# Patient Record
Sex: Female | Born: 1975 | Race: White | Hispanic: No | Marital: Married | Smoking: Current some day smoker
Health system: Southern US, Community
[De-identification: ages and names within clinical notes are randomized; demographics above are authoritative.]

## PROBLEM LIST (undated history)

## (undated) DIAGNOSIS — D75839 Thrombocytosis, unspecified: Secondary | ICD-10-CM

## (undated) DIAGNOSIS — R064 Hyperventilation: Secondary | ICD-10-CM

## (undated) DIAGNOSIS — R002 Palpitations: Secondary | ICD-10-CM

## (undated) DIAGNOSIS — G43909 Migraine, unspecified, not intractable, without status migrainosus: Secondary | ICD-10-CM

## (undated) DIAGNOSIS — R110 Nausea: Secondary | ICD-10-CM

## (undated) DIAGNOSIS — R42 Dizziness and giddiness: Secondary | ICD-10-CM

## (undated) DIAGNOSIS — R200 Anesthesia of skin: Secondary | ICD-10-CM

## (undated) DIAGNOSIS — D72829 Elevated white blood cell count, unspecified: Secondary | ICD-10-CM

## (undated) DIAGNOSIS — D473 Essential (hemorrhagic) thrombocythemia: Secondary | ICD-10-CM

## (undated) DIAGNOSIS — R000 Tachycardia, unspecified: Secondary | ICD-10-CM

## (undated) HISTORY — DX: Anesthesia of skin: R20.0

## (undated) HISTORY — DX: Elevated white blood cell count, unspecified: D72.829

## (undated) HISTORY — DX: Tachycardia, unspecified: R00.0

## (undated) HISTORY — DX: Migraine, unspecified, not intractable, without status migrainosus: G43.909

## (undated) HISTORY — PX: TONSILLECTOMY AND ADENOIDECTOMY: SUR1326

## (undated) HISTORY — DX: Thrombocytosis, unspecified: D75.839

## (undated) HISTORY — DX: Dizziness and giddiness: R42

## (undated) HISTORY — DX: Palpitations: R00.2

## (undated) HISTORY — DX: Nausea: R11.0

## (undated) HISTORY — DX: Hyperventilation: R06.4

## (undated) HISTORY — PX: MYRINGOTOMY WITH TUBE PLACEMENT: SHX5663

## (undated) HISTORY — DX: Essential (hemorrhagic) thrombocythemia: D47.3

---

## 1997-12-06 ENCOUNTER — Other Ambulatory Visit: Admission: RE | Admit: 1997-12-06 | Discharge: 1997-12-06 | Payer: Self-pay | Admitting: Obstetrics and Gynecology

## 1999-01-06 ENCOUNTER — Other Ambulatory Visit: Admission: RE | Admit: 1999-01-06 | Discharge: 1999-01-06 | Payer: Self-pay | Admitting: Obstetrics and Gynecology

## 2000-01-08 ENCOUNTER — Other Ambulatory Visit: Admission: RE | Admit: 2000-01-08 | Discharge: 2000-01-08 | Payer: Self-pay | Admitting: Obstetrics and Gynecology

## 2000-11-21 ENCOUNTER — Inpatient Hospital Stay (HOSPITAL_COMMUNITY): Admission: AD | Admit: 2000-11-21 | Discharge: 2000-11-23 | Payer: Self-pay | Admitting: Obstetrics and Gynecology

## 2000-11-21 ENCOUNTER — Encounter (INDEPENDENT_AMBULATORY_CARE_PROVIDER_SITE_OTHER): Payer: Self-pay | Admitting: Specialist

## 2000-12-30 ENCOUNTER — Other Ambulatory Visit: Admission: RE | Admit: 2000-12-30 | Discharge: 2000-12-30 | Payer: Self-pay | Admitting: Obstetrics and Gynecology

## 2001-04-12 ENCOUNTER — Other Ambulatory Visit: Admission: RE | Admit: 2001-04-12 | Discharge: 2001-04-12 | Payer: Self-pay | Admitting: Obstetrics and Gynecology

## 2002-01-03 ENCOUNTER — Other Ambulatory Visit: Admission: RE | Admit: 2002-01-03 | Discharge: 2002-01-03 | Payer: Self-pay | Admitting: Obstetrics and Gynecology

## 2003-01-10 ENCOUNTER — Other Ambulatory Visit: Admission: RE | Admit: 2003-01-10 | Discharge: 2003-01-10 | Payer: Self-pay | Admitting: Obstetrics and Gynecology

## 2004-01-11 ENCOUNTER — Other Ambulatory Visit: Admission: RE | Admit: 2004-01-11 | Discharge: 2004-01-11 | Payer: Self-pay | Admitting: Obstetrics and Gynecology

## 2004-01-14 ENCOUNTER — Encounter: Admission: RE | Admit: 2004-01-14 | Discharge: 2004-01-14 | Payer: Self-pay | Admitting: Obstetrics and Gynecology

## 2005-01-22 ENCOUNTER — Other Ambulatory Visit: Admission: RE | Admit: 2005-01-22 | Discharge: 2005-01-22 | Payer: Self-pay | Admitting: Obstetrics and Gynecology

## 2006-02-02 ENCOUNTER — Other Ambulatory Visit: Admission: RE | Admit: 2006-02-02 | Discharge: 2006-02-02 | Payer: Self-pay | Admitting: Obstetrics and Gynecology

## 2007-02-07 ENCOUNTER — Other Ambulatory Visit: Admission: RE | Admit: 2007-02-07 | Discharge: 2007-02-07 | Payer: Self-pay | Admitting: Obstetrics and Gynecology

## 2010-04-22 ENCOUNTER — Other Ambulatory Visit: Payer: Self-pay | Admitting: Obstetrics and Gynecology

## 2010-04-22 DIAGNOSIS — N6310 Unspecified lump in the right breast, unspecified quadrant: Secondary | ICD-10-CM

## 2010-04-24 ENCOUNTER — Ambulatory Visit
Admission: RE | Admit: 2010-04-24 | Discharge: 2010-04-24 | Disposition: A | Discharge status: Home / Self Care | Payer: PRIVATE HEALTH INSURANCE | Source: Ambulatory Visit | Attending: Obstetrics and Gynecology | Admitting: Obstetrics and Gynecology

## 2010-04-24 DIAGNOSIS — N6310 Unspecified lump in the right breast, unspecified quadrant: Secondary | ICD-10-CM

## 2010-08-01 NOTE — Discharge Summary (Signed)
[facility]  Patient:    Janice Silva, Janice Silva Visit Number: [id number] MRN: [id number]          Service Type: OBS Location: 910A [id number] Attending Physician:  Wandalee Ferdinand Dictated by:   Rudy Jew Ashley Royalty, M.D. Admit Date:  11/21/2000 Discharge Date: 11/23/2000                             Discharge Summary  DISCHARGE DIAGNOSES:          1. Intrauterine pregnancy at term, delivered.                               2. Meconium stained amniotic fluid.                               3. Anemia.                               4. Term birth living child, vertex.                               5. Postpartum hemorrhage due to uterine atony.  OPERATIONS AND SPECIAL PROCEDURES:                   OB delivery with episiotomy and episiorrhaphy.  CONSULTATIONS:                None.  DISCHARGE MEDICATIONS:        Chromagen.  HISTORY AND PHYSICAL:         This is a 35 year old, gravida 2, para 1, at 40 weeks 5 days gestation. Prenatal care was complicated by smoking which she discontinued Jul 15, 2000. Group B strep was negative. The patient presented complaining of labor with onset at approximately 11:30 p.m. on November 21, 2000. She denied rupture of membranes or bleeding.  HOSPITAL COURSE:              The patient was admitted to [facility] [place]. Initial cervical examination was 5 cm dilated, 90% effaced, -1 station, vertex presentation. Artificial rupture of membranes was accomplished which revealed light meconium. The patient went on to delivery at 2:45 p.m. on November 21, 2000. Infant was an 8-pound 1-ounce female, Apgars 9 at one minute and 9 at five minutes, and sent to newborn nursery. Delivery was accomplished per Dr. Lonell Face over second degree midline episiotomy which extended to a partial third degree. It was repaired without difficulty. Later that day, I was called regarding patients tachycardia and postural symptoms with  transient hypotension. Additional clots were expressed after delivery and bleeding subsequently abated. The patient responded favorably to a fluid bolus. Impression at that time was postpartum hemorrhage due to uterine atony with secondary hypovolemia. The patient was given increased IV fluids and vital signs watched closely. She responded nicely. She was noted to have a significant anemia with a hemoglobin of 5.5 subsequently and a hematocrit of 16.4. Coagulation studies were performed and except for a modest elevation of the PT (15.1), they were normal. On November 23, 2000 she denied any postural symptoms. Hemoglobin was 5.2. Decision was made to obtain another CBC later in the day to see if it was rising. At  12:40 p.m. the hemoglobin was noted to be 5.8 and the patient was felt to be a candidate for discharge. She was hence discharged home afebrile is satisfactory condition.  DISCHARGE INSTRUCTIONS:       Careful discharge instructions were provided.  DISCHARGE FOLLOWUP:           She is to return to [facility] in four to six weeks for postpartum evaluation and sooner, if needed. Dictated by:   Rudy Jew Ashley Royalty, M.D. Attending Physician:  Wandalee Ferdinand DD:  01/08/01 TD:  01/10/01 Job: [id number]

## 2012-12-31 ENCOUNTER — Ambulatory Visit (INDEPENDENT_AMBULATORY_CARE_PROVIDER_SITE_OTHER): Payer: 59 | Admitting: Physician Assistant

## 2012-12-31 VITALS — BP 118/72 | HR 89 | Temp 98.0°F | Resp 16 | Ht 66.5 in | Wt 141.0 lb

## 2012-12-31 DIAGNOSIS — H6120 Impacted cerumen, unspecified ear: Secondary | ICD-10-CM

## 2012-12-31 DIAGNOSIS — H6123 Impacted cerumen, bilateral: Secondary | ICD-10-CM

## 2012-12-31 DIAGNOSIS — H902 Conductive hearing loss, unspecified: Secondary | ICD-10-CM

## 2012-12-31 NOTE — Progress Notes (Signed)
  Subjective:    Patient ID: Janice Silva, female    DOB: Mar 08, 1976, 37 y.o.   MRN: [id number]  HPI 37 year old female presents for evaluation of bilateral ear fullness, decreased hearing, and irritation x 2 weeks.  Left ear worse than right - seem to be constant full feeling with significantly decreased hearing.  Right ear intermittently bothersome.  Denies otalgia, nasal congestion, PND, sore throat, headache, dizziness, or tinnitus.  No hx of cerumen impaction. She has been taking Sudafed which has not helped. Has also tried hydrogen peroxide which only made her canals itchy.   Patient is otherwise doing well with no other concerns today.     Review of Systems  HENT: Negative for congestion, ear discharge, ear pain, postnasal drip and sore throat.   Gastrointestinal: Negative for nausea and vomiting.  Neurological: Negative for dizziness and headaches.       Objective:   Physical Exam  Constitutional: She is oriented to person, place, and time. She appears well-developed and well-nourished.  HENT:  Head: Normocephalic and atraumatic.  Right Ear: Hearing, tympanic membrane, external ear and ear canal normal.  Left Ear: Hearing, tympanic membrane, external ear and ear canal normal.  Bilateral cerumen impaction. Normal TM visualized s/p irrigation  Eyes: Conjunctivae are normal.  Neck: Normal range of motion.  Cardiovascular: Normal rate.   Pulmonary/Chest: Effort normal.  Neurological: She is alert and oriented to person, place, and time.  Psychiatric: She has a normal mood and affect. Her behavior is normal. Judgment and thought content normal.          Assessment & Plan:   Cerumen impaction, bilateral - Plan: Ear wax removal, Ear wax removal  Conductive hearing loss  Ears irrigated today. Patient reports 100% resolution of symptoms s/p irrigation.  Ear care instructions provided Follow up as needed.

## 2013-09-27 ENCOUNTER — Ambulatory Visit (INDEPENDENT_AMBULATORY_CARE_PROVIDER_SITE_OTHER): Payer: 59 | Admitting: Family Medicine

## 2013-09-27 ENCOUNTER — Telehealth: Payer: Self-pay

## 2013-09-27 VITALS — BP 118/82 | HR 100 | Temp 97.7°F | Resp 18 | Ht 65.5 in | Wt 143.0 lb

## 2013-09-27 DIAGNOSIS — J029 Acute pharyngitis, unspecified: Secondary | ICD-10-CM

## 2013-09-27 LAB — POCT RAPID STREP A (OFFICE): Rapid Strep A Screen: NEGATIVE

## 2013-09-27 MED ORDER — AMOXICILLIN 875 MG PO TABS: 875.0000 mg | ORAL_TABLET | Freq: Two times a day (BID) | ORAL | Status: DC

## 2013-09-27 MED ORDER — IPRATROPIUM BROMIDE 0.06 % NA SOLN: 2.0000 | Freq: Four times a day (QID) | NASAL | Status: DC

## 2013-09-27 MED ORDER — IPRATROPIUM BROMIDE 0.03 % NA SOLN: 2.0000 | Freq: Two times a day (BID) | NASAL | Status: DC

## 2013-09-27 NOTE — Progress Notes (Signed)
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 Has 2 children, nobody has been sick. 2 days ago she started with severe head congestion when she got up. She has persisted with a very tight knows that she cannot breathe through. She is blowing clear mucus. Her throat has become very sore. She had low-grade fever yesterday for a while. No cough. Generally is a healthy person, only medication as her oral contraceptives.  Objective: TMs are normal. Nose congested and see she speaks nasally. Throat is very erythematous she has a thick white mucoid coating on the side of her throat behind the uvula. Strep screen and culture were taken. Neck was supple with major nodes. Chest is clear to auscultation. Heart regular without murmurs.  Assessment: Pharyngitis Rhinitis  Plan: Strep screen and culture if needed  Results for orders placed in visit on 09/27/13  POCT RAPID STREP A (OFFICE)      Result Value Ref Range   Rapid Strep A Screen Negative  Negative   Will treat empirically until the culture comes back. Continue for a full 10 days if it comes back positive, but if negative she can discontinue when the report comes back.

## 2013-09-27 NOTE — Telephone Encounter (Signed)
Colletta [place] the pharmacist from [place] called  [place] prescription for Ipratropium Bromide (Solution) ATROVENT 0.03 %  Has been discontinued and it only comes in .06% now. Please advise at [phone]

## 2013-09-27 NOTE — Telephone Encounter (Signed)
Meds ordered this encounter  Medications  . ipratropium (ATROVENT) 0.06 % nasal spray    Sig: Place 2 sprays into both nostrils 4 (four) times daily.    Dispense:  15 mL    Refill:  0    Order Specific Question:  Supervising Provider    Answer:  DOOLITTLE, ROBERT P [[id number]]

## 2013-09-27 NOTE — Patient Instructions (Signed)
Take amoxicillin 875 one twice daily. If the culture is negative you can discontinue this and destroyed the remaining pills  Use the Atrovent nasal spray 2 sprays each nostril 3 or 4 times daily as needed to open the nose  Return if worse  Pharyngitis Pharyngitis is redness, pain, and swelling (inflammation) of your pharynx.  CAUSES  Pharyngitis is usually caused by infection. Most of the time, these infections are from viruses (viral) and are part of a cold. However, sometimes pharyngitis is caused by bacteria (bacterial). Pharyngitis can also be caused by allergies. Viral pharyngitis may be spread from person to person by coughing, sneezing, and personal items or utensils (cups, forks, spoons, toothbrushes). Bacterial pharyngitis may be spread from person to person by more intimate contact, such as kissing.  SIGNS AND SYMPTOMS  Symptoms of pharyngitis include:   Sore throat.   Tiredness (fatigue).   Low-grade fever.   Headache.  Joint pain and muscle aches.  Skin rashes.  Swollen lymph nodes.  Plaque-like film on throat or tonsils (often seen with bacterial pharyngitis). DIAGNOSIS  Your health care provider will ask you questions about your illness and your symptoms. Your medical history, along with a physical exam, is often all that is needed to diagnose pharyngitis. Sometimes, a rapid strep test is done. Other lab tests may also be done, depending on the suspected cause.  TREATMENT  Viral pharyngitis will usually get better in 3-4 days without the use of medicine. Bacterial pharyngitis is treated with medicines that kill germs (antibiotics).  HOME CARE INSTRUCTIONS   Drink enough water and fluids to keep your urine clear or pale yellow.   Only take over-the-counter or prescription medicines as directed by your health care provider:   If you are prescribed antibiotics, make sure you finish them even if you start to feel better.   Do not take aspirin.   Get lots of  rest.   Gargle with 8 oz of salt water ( tsp of salt per 1 qt of water) as often as every 1-2 hours to soothe your throat.   Throat lozenges (if you are not at risk for choking) or sprays may be used to soothe your throat. SEEK MEDICAL CARE IF:   You have large, tender lumps in your neck.  You have a rash.  You cough up green, yellow-brown, or bloody spit. SEEK IMMEDIATE MEDICAL CARE IF:   Your neck becomes stiff.  You drool or are unable to swallow liquids.  You vomit or are unable to keep medicines or liquids down.  You have severe pain that does not go away with the use of recommended medicines.  You have trouble breathing (not caused by a stuffy nose). MAKE SURE YOU:   Understand these instructions.  Will watch your condition.  Will get help right away if you are not doing well or get worse. Document Released: 03/02/2005 Document Revised: 12/21/2012 Document Reviewed: 11/07/2012 [facility] Patient Information 2015 [place], [place]. This information is not intended to replace advice given to you by your health care provider. Make sure you discuss any questions you have with your health care provider.

## 2013-09-29 LAB — CULTURE, GROUP A STREP

## 2014-04-17 ENCOUNTER — Ambulatory Visit (INDEPENDENT_AMBULATORY_CARE_PROVIDER_SITE_OTHER): Payer: BLUE CROSS/BLUE SHIELD | Admitting: Physician Assistant

## 2014-04-17 VITALS — BP 118/82 | HR 106 | Temp 98.8°F | Resp 18 | Ht 66.25 in | Wt 143.0 lb

## 2014-04-17 DIAGNOSIS — H10022 Other mucopurulent conjunctivitis, left eye: Secondary | ICD-10-CM

## 2014-04-17 MED ORDER — POLYMYXIN B-TRIMETHOPRIM 10000-0.1 UNIT/ML-% OP SOLN: 1.0000 [drp] | Freq: Four times a day (QID) | OPHTHALMIC | Status: DC

## 2014-04-17 NOTE — Progress Notes (Signed)
04/17/2014 at 9:19 AM  Janice Silva / DOB: 1976-02-06 / MRN: [id number]  The patient  does not have a problem list on file.  SUBJECTIVE  Chief compalaint: Eye Problem  Eye Problem  The left eye is affected. This is a new problem. The current episode started yesterday. The problem has been waxing and waning. The pain is at a severity of 3/10. There is known exposure to pink eye. She wears contacts. Associated symptoms include an eye discharge (clear), eye redness and photophobia. Pertinent negatives include no foreign body sensation, itching, recent URI or vomiting. She has tried nothing for the symptoms.    She  has no past medical history on file.    She has a current medication list which includes the following prescription(s): ipratropium, norethindrone-ethinyl estradiol-iron, and trimethoprim-polymyxin b.  Janice Silva has No Known Allergies. She  reports that she has been smoking Cigarettes.  She does not have any smokeless tobacco history on file. She reports that she does not drink alcohol or use illicit drugs. She  reports that she currently engages in sexual activity.  The patient  has past surgical history that includes Tonsillectomy and adenoidectomy.  Her family history is not on file.  Review of Systems  Eyes: Positive for photophobia, discharge (clear) and redness.  Gastrointestinal: Negative for vomiting.  Skin: Negative for itching.    OBJECTIVE  her  height is 5' 6.25" (1.683 m) and weight is 143 lb (64.864 kg). Her oral temperature is 98.8 F (37.1 C). Her blood pressure is 118/82 and her pulse is 106. Her respiration is 18 and oxygen saturation is 99%.  The patient's body mass index is 22.9 kg/(m^2).  Physical Exam  Vitals reviewed. Constitutional: She is oriented to person, place, and time. She appears well-developed and well-nourished.  Eyes: Conjunctivae and EOM are normal. Pupils are equal, round, and reactive to light. Lids are everted and swept, no foreign  bodies found. Left eye exhibits discharge. Left eye exhibits no exudate and no hordeolum. No foreign body present in the left eye.  Slit lamp exam:      The left eye shows no corneal abrasion, no corneal ulcer, no foreign body, no hyphema, no hypopyon and no fluorescein uptake.  Cardiovascular: Normal rate.   Respiratory: Effort normal.  Neurological: She is alert and oriented to person, place, and time.  Skin: Skin is warm and dry.  Psychiatric: She has a normal mood and affect.     Visual Acuity Screening   Right eye Left eye Both eyes  Without correction:     With correction: 20/13 20/13 20/13   Comments: Pt. Can not see without glasses.   Procedure: Verbal consent obtained.  Two drops of proparacaine was placed in the eye and fluorescein strip applied directly to the eye.  The eye was viewed under UV light and no corneal abrasion or ulcer was identified.     No results found for this or any previous visit (from the past 24 hour(s)).  ASSESSMENT & PLAN  Janice Silva was seen today for eye problem.  Diagnoses and associated orders for this visit:  Pink eye disease of left eye: Symptoms and exam reassuring.  Vision equal and normal bilaterally. No abrasion or ulcer was identified on staining.  Will treat for viral etiology. Advised warm compress and OTC pain reliever of choice.  Should she begin to develop purulent discharge she is to fill the below antibiotic.  Advised that she call or RTC should she develop changes in  vision and/or nausea.   - trimethoprim-polymyxin b (POLYTRIM) ophthalmic solution; Place 1 drop into the left eye every 6 (six) hours.     The patient was instructed to to call or comeback to clinic as needed, or should symptoms warrant.  Philis Fendt, MHS, PA-C [facility] 04/17/2014 9:19 AM

## 2014-09-16 ENCOUNTER — Ambulatory Visit (INDEPENDENT_AMBULATORY_CARE_PROVIDER_SITE_OTHER): Payer: BLUE CROSS/BLUE SHIELD | Admitting: Urgent Care

## 2014-09-16 VITALS — BP 134/86 | HR 84 | Temp 98.1°F | Resp 18 | Ht 66.0 in | Wt 141.0 lb

## 2014-09-16 DIAGNOSIS — D473 Essential (hemorrhagic) thrombocythemia: Secondary | ICD-10-CM | POA: Diagnosis not present

## 2014-09-16 DIAGNOSIS — D72829 Elevated white blood cell count, unspecified: Secondary | ICD-10-CM

## 2014-09-16 DIAGNOSIS — R002 Palpitations: Secondary | ICD-10-CM

## 2014-09-16 DIAGNOSIS — R202 Paresthesia of skin: Secondary | ICD-10-CM | POA: Diagnosis not present

## 2014-09-16 DIAGNOSIS — R000 Tachycardia, unspecified: Secondary | ICD-10-CM

## 2014-09-16 DIAGNOSIS — R42 Dizziness and giddiness: Secondary | ICD-10-CM

## 2014-09-16 DIAGNOSIS — D75839 Thrombocytosis, unspecified: Secondary | ICD-10-CM

## 2014-09-16 DIAGNOSIS — R200 Anesthesia of skin: Secondary | ICD-10-CM

## 2014-09-16 LAB — POCT CBC
Granulocyte percent: 78.7 %G (ref 37–80)
HCT, POC: 42.8 % (ref 37.7–47.9)
Hemoglobin: 13.5 g/dL (ref 12.2–16.2)
Lymph, poc: 3.1 (ref 0.6–3.4)
MCH, POC: 26.9 pg — AB (ref 27–31.2)
MCHC: 31.5 g/dL — AB (ref 31.8–35.4)
MCV: 85.4 fL (ref 80–97)
MID (cbc): 0.6 (ref 0–0.9)
MPV: 6.6 fL (ref 0–99.8)
POC Granulocyte: 13.7 — AB (ref 2–6.9)
POC LYMPH PERCENT: 17.8 %L (ref 10–50)
POC MID %: 3.5 %M (ref 0–12)
Platelet Count, POC: 556 10*3/uL — AB (ref 142–424)
RBC: 5.02 M/uL (ref 4.04–5.48)
RDW, POC: 13.3 %
WBC: 17.4 10*3/uL — AB (ref 4.6–10.2)

## 2014-09-16 LAB — COMPREHENSIVE METABOLIC PANEL
ALT: 19 U/L (ref 0–35)
AST: 16 U/L (ref 0–37)
Albumin: 4 g/dL (ref 3.5–5.2)
Alkaline Phosphatase: 85 U/L (ref 39–117)
BUN: 6 mg/dL (ref 6–23)
CO2: 28 mEq/L (ref 19–32)
Calcium: 9.6 mg/dL (ref 8.4–10.5)
Chloride: 100 mEq/L (ref 96–112)
Creat: 0.79 mg/dL (ref 0.50–1.10)
Glucose, Bld: 84 mg/dL (ref 70–99)
Potassium: 5.1 mEq/L (ref 3.5–5.3)
Sodium: 138 mEq/L (ref 135–145)
Total Bilirubin: 0.2 mg/dL (ref 0.2–1.2)
Total Protein: 7.4 g/dL (ref 6.0–8.3)

## 2014-09-16 LAB — TSH: TSH: 0.735 u[IU]/mL (ref 0.350–4.500)

## 2014-09-16 LAB — HIV ANTIBODY (ROUTINE TESTING W REFLEX): HIV 1&2 Ab, 4th Generation: NONREACTIVE

## 2014-09-16 LAB — GLUCOSE, POCT (MANUAL RESULT ENTRY): POC Glucose: 82 mg/dl (ref 70–99)

## 2014-09-16 MED ORDER — PROPRANOLOL HCL 10 MG PO TABS: 10.0000 mg | ORAL_TABLET | Freq: Two times a day (BID) | ORAL | Status: DC

## 2014-09-16 NOTE — Progress Notes (Signed)
MRN: [id number] DOB: 12/10/1975  Subjective:   Janice Silva is a 39 y.o. female presenting for chief complaint of Palpitations; Dizziness; and Nausea  Reports 3 day history of 2 episodes of palpitations, heart racing, dizziness, hyperventilation, nausea, numbness and tingling of hands and feet, becomes very pale. Episodes have lasted <5 minutes without any known aggravating factors. Of note, patient was at work on Thursday when first episode occurred, was seen and stabilized by EMT without any significant findings. Today, patient had another episode shortly after arriving to church this morning, episode resolved on its own. Denies chest pain, diaphoresis, limb pain, neck pain, jaw pain, syncope. Denies history of heart disease, diabetes, abnormal heart rhythms. Drinks ~2 cups of coffee daily and hydrates well throughout the day thereafter. Smokes <1/2 ppd, drinks 1 alcohol drink per week. Denies any other aggravating or relieving factors, no other questions or concerns.  Janice Silva has a current medication list which includes the following prescription(s): norethindrone-ethinyl estradiol-iron. She has No Known Allergies.  Janice Silva  has no past medical history on file. Also  has past surgical history that includes Tonsillectomy and adenoidectomy.  ROS As in subjective.  Objective:   Vitals: BP 134/86 mmHg  Pulse 84  Temp(Src) 98.1 F (36.7 C)  Resp 18  Ht 5\' 6"  (1.676 m)  Wt 141 lb (63.957 kg)  BMI 22.77 kg/m2  SpO2 98%  LMP 09/16/2014  Physical Exam  Constitutional: She is oriented to person, place, and time. She appears well-developed and well-nourished.  HENT:  Mouth/Throat: Oropharynx is clear and moist.  Eyes: Conjunctivae are normal. Pupils are equal, round, and reactive to light. No scleral icterus.  Neck: Normal range of motion. Neck supple. No thyromegaly present.  Cardiovascular: Normal rate, regular rhythm and intact distal pulses.  Exam reveals no gallop and no friction  rub.   No murmur heard. Pulmonary/Chest: No stridor. No respiratory distress. She has no wheezes. She has no rales.  Abdominal: Soft. Bowel sounds are normal. She exhibits no distension and no mass. There is no tenderness.  Musculoskeletal: Normal range of motion. She exhibits no edema or tenderness.  Lymphadenopathy:    She has no cervical adenopathy.  Neurological: She is alert and oriented to person, place, and time.  Skin: Skin is warm and dry. No rash noted. No erythema. No pallor.  Psychiatric: She has a normal mood and affect.   Results for orders placed or performed in visit on 09/16/14 (from the past 24 hour(s))  POCT glucose (manual entry)     Status: None   Collection Time: 09/16/14  1:33 PM  Result Value Ref Range   POC Glucose 82 70 - 99 mg/dl  POCT CBC     Status: Abnormal   Collection Time: 09/16/14  1:33 PM  Result Value Ref Range   WBC 17.4 (A) 4.6 - 10.2 K/uL   Lymph, poc 3.1 0.6 - 3.4   POC LYMPH PERCENT 17.8 10 - 50 %L   MID (cbc) 0.6 0 - 0.9   POC MID % 3.5 0 - 12 %M   POC Granulocyte 13.7 (A) 2 - 6.9   Granulocyte percent 78.7 37 - 80 %G   RBC 5.02 4.04 - 5.48 M/uL   Hemoglobin 13.5 12.2 - 16.2 g/dL   HCT, POC 42.8 37.7 - 47.9 %   MCV 85.4 80 - 97 fL   MCH, POC 26.9 (A) 27 - 31.2 pg   MCHC 31.5 (A) 31.8 - 35.4 g/dL   RDW, POC  13.3 %   Platelet Count, POC 556 (A) 142 - 424 K/uL   MPV 6.6 0 - 99.8 fL   Assessment and Plan :   1. Palpitations 2. Racing heart beat 3. Dizziness 4. Numbness and tingling 5. Thrombocytosis 6. Elevated WBC count - Unclear etiology, labs pending, suspect anxiety component but will also try to r/o cardiologic source. Will recheck cbc in 2-4 weeks. Patient agreed to start Propranolol 10mg . Will refer to cardiology for Holter monitor study and further work-up.  Jaynee Eagles, PA-C [facility] [phone] 09/16/2014 1:02 PM

## 2014-09-16 NOTE — Patient Instructions (Signed)

## 2014-09-18 LAB — PATHOLOGIST SMEAR REVIEW

## 2014-09-25 ENCOUNTER — Telehealth: Payer: Self-pay | Admitting: Urgent Care

## 2014-09-25 NOTE — Telephone Encounter (Signed)
Left message. If she does not call back I will send out a letter. Thank you for your efforts!

## 2014-09-25 NOTE — Telephone Encounter (Signed)
New Message  This message is to inform you that we have made 3 consecutive attempts to contact your patient. We were unsuccessful in these attempts and wanted you to be aware of our efforts. Will remove the patient from our referral work queue at this time  Jarold Motto [facility]

## 2014-11-19 DIAGNOSIS — R9431 Abnormal electrocardiogram [ECG] [EKG]: Secondary | ICD-10-CM | POA: Insufficient documentation

## 2014-11-19 NOTE — Progress Notes (Signed)
Cardiology Office Note   Date:  11/20/2014   ID:  [name], DOB January 23, 1976, MRN [id number]  PCP:  No primary care provider on file.    Chief Complaint  Patient presents with  . New Evaluation    Palpitations      History of Present Illness: Janice Silva is a 39 y.o. female presenting for evalatuion of Palpitations; Dizziness; and Nausea.  She gives a history of 2 episodes of palpitations, heart racing, dizziness, hyperventilation, nausea, numbness and tingling of hands and feet, becomes very pale. Episodes have lasted <5 minutes without any known aggravating factors. Of note, patient was at work  when first episode occurred and was seen and stabilized by EMT without any significant findings. She had another episode shortly after arriving to church and the pisode resolved on its own. She denies chest pain, diaphoresis, limb pain, neck pain, jaw pain, syncope. She denies history of heart disease, diabetes, abnormal heart rhythms. She drinks ~2 cups of coffee daily and hydrates well throughout the day thereafter. Smokes <1/2 ppd, drinks 1 alcohol drink per week. Denies any other aggravating or relieving factors, no other questions or concerns.  She was started on Propranolol.  EKG showed NSR with rSR' in V1.    Past Medical History  Diagnosis Date  . Palpitation   . Racing heart beat   . Dizziness   . Thrombocytosis   . Elevated WBC count   . Numbness of hand   . Numbness of feet   . Nausea   . Hyperventilation     Past Surgical History  Procedure Laterality Date  . Tonsillectomy and adenoidectomy       Current Outpatient Prescriptions  Medication Sig Dispense Refill  . norethindrone (HEATHER) 0.35 MG tablet Take 1 tablet by mouth daily.    . propranolol (INDERAL) 10 MG tablet Take 1 tablet (10 mg total) by mouth 2 (two) times daily. 60 tablet 1   No current facility-administered medications for this visit.    Allergies:   Review of patient's  allergies indicates no known allergies.    Social History:  The patient  reports that she has been smoking Cigarettes.  She has been smoking about 0.50 packs per day. She does not have any smokeless tobacco history on file. She reports that she drinks alcohol. She reports that she does not use illicit drugs.   Family History:  The patient's family history includes Lupus in her mother.    ROS:  Please see the history of present illness.   Otherwise, review of systems are positive for none.   All other systems are reviewed and negative.    PHYSICAL EXAM: VS:  BP 100/64 mmHg  Pulse 84  Ht 5\' 6"  (1.676 m)  Wt 143 lb 9.6 oz (65.137 kg)  BMI 23.19 kg/m2  SpO2 98% , BMI Body mass index is 23.19 kg/(m^2). GEN: Well nourished, well developed, in no acute distress HEENT: normal Neck: no JVD, carotid bruits, or masses Cardiac: RRR; no murmurs, rubs, or gallops,no edema  Respiratory:  clear to auscultation bilaterally, normal work of breathing GI: soft, nontender, nondistended, + BS MS: no deformity or atrophy Skin: warm and dry, no rash Neuro:  Strength and sensation are intact Psych: euthymic mood, full affect   EKG:  EKG is not ordered today.    Recent Labs: 09/16/2014: ALT 19; BUN 6; Creat 0.79; Hemoglobin 13.5; Potassium 5.1;  Sodium 138; TSH 0.735    Lipid Panel No results found for: CHOL, TRIG, HDL, CHOLHDL, VLDL, LDLCALC, LDLDIRECT    Wt Readings from Last 3 Encounters:  11/20/14 143 lb 9.6 oz (65.137 kg)  09/16/14 141 lb (63.957 kg)  04/17/14 143 lb (64.864 kg)        ASSESSMENT AND PLAN:  1.  Abnormal EKG with rSR' in V1 - will get 2D echo 2.  Palpitations - will get a 30 day event monitor 3.  Dizziness with palpitations, numbness of hands and feet that sounds like hyperventilation - ? Panic attacks.     Current medicines are reviewed at length with the patient today.  The patient does not have concerns regarding medicines.  The following changes have been made:   no change  Labs/ tests ordered today: See above Assessment and Plan No orders of the defined types were placed in this encounter.     Disposition:   FU with PRN pending results of studies  SignedSueanne Margarita, MD  11/20/2014 9:50 AM    [facility] [address], [place], [place]  [address] Phone: [phone]; Fax: [phone]

## 2014-11-20 ENCOUNTER — Encounter: Payer: Self-pay | Admitting: Cardiology

## 2014-11-20 ENCOUNTER — Ambulatory Visit (INDEPENDENT_AMBULATORY_CARE_PROVIDER_SITE_OTHER): Payer: BLUE CROSS/BLUE SHIELD | Admitting: Cardiology

## 2014-11-20 ENCOUNTER — Ambulatory Visit (INDEPENDENT_AMBULATORY_CARE_PROVIDER_SITE_OTHER): Payer: BLUE CROSS/BLUE SHIELD

## 2014-11-20 VITALS — BP 100/64 | HR 84 | Ht 66.0 in | Wt 143.6 lb

## 2014-11-20 DIAGNOSIS — R42 Dizziness and giddiness: Secondary | ICD-10-CM

## 2014-11-20 DIAGNOSIS — R002 Palpitations: Secondary | ICD-10-CM | POA: Diagnosis not present

## 2014-11-20 DIAGNOSIS — R9431 Abnormal electrocardiogram [ECG] [EKG]: Secondary | ICD-10-CM

## 2014-11-20 NOTE — Patient Instructions (Signed)
Medication Instructions:  Your physician recommends that you continue on your current medications as directed. Please refer to the Current Medication list given to you today.   Labwork: None  Testing/Procedures: Your physician has requested that you have an echocardiogram. Echocardiography is a painless test that uses sound waves to create images of your heart. It provides your doctor with information about the size and shape of your heart and how well your heart's chambers and valves are working. This procedure takes approximately one hour. There are no restrictions for this procedure.   Your physician has recommended that you wear an event monitor. Event monitors are medical devices that record the heart's electrical activity. Doctors most often us these monitors to diagnose arrhythmias. Arrhythmias are problems with the speed or rhythm of the heartbeat. The monitor is a small, portable device. You can wear one while you do your normal daily activities. This is usually used to diagnose what is causing palpitations/syncope (passing out).  Follow-Up: Your physician recommends that you schedule a follow-up appointment AS NEEDED with Dr. Turner pending your study results.   Any Other Special Instructions Will Be Listed Below (If Applicable). 

## 2014-11-27 ENCOUNTER — Ambulatory Visit (HOSPITAL_COMMUNITY): Payer: BLUE CROSS/BLUE SHIELD | Attending: Cardiology

## 2014-11-27 ENCOUNTER — Other Ambulatory Visit: Payer: Self-pay

## 2014-11-27 DIAGNOSIS — R9431 Abnormal electrocardiogram [ECG] [EKG]: Secondary | ICD-10-CM

## 2014-11-27 DIAGNOSIS — R42 Dizziness and giddiness: Secondary | ICD-10-CM

## 2014-11-28 ENCOUNTER — Telehealth: Payer: Self-pay | Admitting: Cardiology

## 2014-11-28 NOTE — Telephone Encounter (Signed)
-----   Message from Sueanne Margarita, MD sent at 11/27/2014 10:22 PM EDT ----- Normal echo

## 2014-11-28 NOTE — Telephone Encounter (Signed)
New message  ° ° ° °Pt returning call about test results  °

## 2014-11-28 NOTE — Telephone Encounter (Signed)
Informed patient of results and verbal understanding expressed.  

## 2015-06-10 ENCOUNTER — Other Ambulatory Visit: Payer: Self-pay | Admitting: Physician Assistant

## 2015-06-10 ENCOUNTER — Ambulatory Visit (INDEPENDENT_AMBULATORY_CARE_PROVIDER_SITE_OTHER): Payer: BLUE CROSS/BLUE SHIELD | Admitting: Physician Assistant

## 2015-06-10 VITALS — BP 112/72 | HR 78 | Temp 98.5°F | Resp 18 | Ht 66.14 in | Wt 145.0 lb

## 2015-06-10 DIAGNOSIS — R42 Dizziness and giddiness: Secondary | ICD-10-CM

## 2015-06-10 DIAGNOSIS — R110 Nausea: Secondary | ICD-10-CM

## 2015-06-10 LAB — BASIC METABOLIC PANEL
BUN: 9 mg/dL (ref 7–25)
CO2: 25 mmol/L (ref 20–31)
Calcium: 9.3 mg/dL (ref 8.6–10.2)
Chloride: 104 mmol/L (ref 98–110)
Creat: 0.78 mg/dL (ref 0.50–1.10)
Glucose, Bld: 78 mg/dL (ref 65–99)
Potassium: 4.3 mmol/L (ref 3.5–5.3)
Sodium: 139 mmol/L (ref 135–146)

## 2015-06-10 LAB — POCT SEDIMENTATION RATE: POCT SED RATE: 29 mm/hr — AB (ref 0–22)

## 2015-06-10 LAB — GLUCOSE, POCT (MANUAL RESULT ENTRY): POC Glucose: 85 mg/dl (ref 70–99)

## 2015-06-10 LAB — POCT CBC
Granulocyte percent: 63.7 %G (ref 37–80)
HCT, POC: 41.2 % (ref 37.7–47.9)
Hemoglobin: 14.8 g/dL (ref 12.2–16.2)
Lymph, poc: 2.5 (ref 0.6–3.4)
MCH, POC: 30.6 pg (ref 27–31.2)
MCHC: 35.9 g/dL — AB (ref 31.8–35.4)
MCV: 85.3 fL (ref 80–97)
MID (cbc): 0.7 (ref 0–0.9)
MPV: 7.1 fL (ref 0–99.8)
POC Granulocyte: 5.5 (ref 2–6.9)
POC LYMPH PERCENT: 28.4 %L (ref 10–50)
POC MID %: 7.9 %M (ref 0–12)
Platelet Count, POC: 306 10*3/uL (ref 142–424)
RBC: 4.83 M/uL (ref 4.04–5.48)
RDW, POC: 12.8 %
WBC: 8.7 10*3/uL (ref 4.6–10.2)

## 2015-06-10 LAB — THYROID PANEL WITH TSH
Free Thyroxine Index: 2.5 (ref 1.4–3.8)
T3 Uptake: 28 % (ref 22–35)
T4, Total: 9 ug/dL (ref 4.5–12.0)
TSH: 1.03 mIU/L

## 2015-06-10 LAB — POCT GLYCOSYLATED HEMOGLOBIN (HGB A1C): Hemoglobin A1C: 5.4

## 2015-06-10 NOTE — Progress Notes (Signed)
[facility] [address], [place] [address] [phone]  Date:  06/10/2015   Name:  Janice Silva   DOB:  [birth year]   MRN:  [id number]  PCP:  No PCP Per Patient    History of Present Illness:  Janice Silva is a 40 y.o. female patient who presents to [facility] for cc of lightheadedness, nausea, and tingling of her hands and feet.  She was at work, attempting to eat breakfast about 3 hours ago when she developed lightheadedness, nauseous.  She layed down for a second at her work.  Nausea and lightheadedness improved, but she had tingling in her hands and feet.   These sxs occur a couple times per week.  Usually while driving.  She reports no stressors at the time.  No cp, palpitaitons, sob, no blurriness.  She was lightheaded and nausea.  There was no swelling or change in color of the extremities.  Patient was seen here 10 months ago for similar symptoms including palpitations. She was referred to cardiology where Holter monitor was placed. She was also given propanolol. She said that she had no symptoms during the times a Holter monitor that she would have a couple times per week prior to that evaluation. She was not on the propanolol during the Holter monitor that was done for an entire month. He did white blood Cell count, her sedimentation rate was normal, TSH was normal. Patient current does not have menstrual cycle. She is on the [place]. Her symptoms initiated before the start of her OCP. Family history of lupus with mother.  She denies any current chest pains, palpitations, shortness of breath, diaphoresis, skin changes, bowel movement changes.  Mother has lupus.    Patient Active Problem List   Diagnosis Date Noted  . Abnormal EKG 11/19/2014  . Thrombocytosis ([place]) 09/16/2014  . Palpitations 09/16/2014  . Dizziness 09/16/2014    Past Medical History  Diagnosis Date  . Palpitation   . Racing heart beat   . Dizziness   . Thrombocytosis ([place])   .  Elevated WBC count   . Numbness of hand   . Numbness of feet   . Nausea   . Hyperventilation     Past Surgical History  Procedure Laterality Date  . Tonsillectomy and adenoidectomy      Social History  Substance Use Topics  . Smoking status: Current Every Day Smoker -- 0.50 packs/day    Types: Cigarettes  . Smokeless tobacco: None  . Alcohol Use: 0.0 oz/week    0 Standard drinks or equivalent per week     Comment: social    Family History  Problem Relation Age of Onset  . Lupus Mother     No Known Allergies  Medication list has been reviewed and updated.  Current Outpatient Prescriptions on File Prior to Visit  Medication Sig Dispense Refill  . norethindrone (HEATHER) 0.35 MG tablet Take 1 tablet by mouth daily.    . propranolol (INDERAL) 10 MG tablet Take 1 tablet (10 mg total) by mouth 2 (two) times daily. (Patient not taking: Reported on 06/10/2015) 60 tablet 1   No current facility-administered medications on file prior to visit.    ROS ROS otherwise unremarkable unless listed above.  Physical Examination: BP 112/72 mmHg  Pulse 78  Temp(Src) 98.5 F (36.9 C) (Oral)  Resp 18  Ht 5' 6.14" (1.68 m)  Wt 145 lb (65.772 kg)  BMI 23.30 kg/m2  SpO2 98% Ideal Body Weight: Weight in (  lb) to have BMI = 25: 155.2  Physical Exam  Constitutional: She is oriented to person, place, and time. She appears well-developed and well-nourished. No distress.  HENT:  Head: Normocephalic and atraumatic.  Right Ear: Tympanic membrane, external ear and ear canal normal.  Left Ear: Tympanic membrane, external ear and ear canal normal.  Nose: Right sinus exhibits no maxillary sinus tenderness and no frontal sinus tenderness. Left sinus exhibits no maxillary sinus tenderness and no frontal sinus tenderness.  Mouth/Throat: Oropharynx is clear and moist. No uvula swelling. No oropharyngeal exudate, posterior oropharyngeal edema or posterior oropharyngeal erythema.  Eyes: Conjunctivae  and EOM are normal. Pupils are equal, round, and reactive to light.  Neck: Normal range of motion. Neck supple. No thyromegaly present.  Cardiovascular: Normal rate, regular rhythm, normal heart sounds and intact distal pulses.  Exam reveals no gallop, no distant heart sounds and no friction rub.   No murmur heard. Pulmonary/Chest: Effort normal and breath sounds normal. No respiratory distress. She has no decreased breath sounds. She has no wheezes. She has no rhonchi.  Musculoskeletal: Normal range of motion. She exhibits no edema or tenderness.  Weak grip strength, however this is her norm.  Lymphadenopathy:       Head (right side): No submandibular, no tonsillar, no preauricular and no posterior auricular adenopathy present.       Head (left side): No submandibular, no tonsillar, no preauricular and no posterior auricular adenopathy present.    She has no cervical adenopathy.  Neurological: She is alert and oriented to person, place, and time. No cranial nerve deficit. She exhibits normal muscle tone. Coordination normal.  Skin: Skin is warm and dry. She is not diaphoretic.  Psychiatric: She has a normal mood and affect. Her behavior is normal.    Results for orders placed or performed in visit on 06/10/15  POCT CBC  Result Value Ref Range   WBC 8.7 4.6 - 10.2 K/uL   Lymph, poc 2.5 0.6 - 3.4   POC LYMPH PERCENT 28.4 10 - 50 %L   MID (cbc) 0.7 0 - 0.9   POC MID % 7.9 0 - 12 %M   POC Granulocyte 5.5 2 - 6.9   Granulocyte percent 63.7 37 - 80 %G   RBC 4.83 4.04 - 5.48 M/uL   Hemoglobin 14.8 12.2 - 16.2 g/dL   HCT, POC 41.2 37.7 - 47.9 %   MCV 85.3 80 - 97 fL   MCH, POC 30.6 27 - 31.2 pg   MCHC 35.9 (A) 31.8 - 35.4 g/dL   RDW, POC 12.8 %   Platelet Count, POC 306 142 - 424 K/uL   MPV 7.1 0 - 99.8 fL  POCT glycosylated hemoglobin (Hb A1C)  Result Value Ref Range   Hemoglobin A1C 5.4   POCT glucose (manual entry)  Result Value Ref Range   POC Glucose 85 70 - 99 mg/dl    Orthostatic VS for the past 24 hrs (Last 3 readings):  BP- Lying Pulse- Lying BP- Sitting Pulse- Sitting BP- Standing at 0 minutes Pulse- Standing at 0 minutes  06/10/15 1227 97/66 mmHg 63 109/75 mmHg 65 102/72 mmHg 86    Assessment and Plan: Rosalin Causer is a 40 y.o. female who is here today Chief complaint of lightheadedness and nausea. We will perform further lab work regarding a sedimentation rate, and may at this time. We will also get a full panel to address thyroid.  Orthostatic.  Pending normal labs we will refer her back  to cardiology.  Advised her to hydrate well as she does (>64oz)  Plan discussed with Dr. Everlene Farrier and tx plan is agreeable.  Lightheaded - Plan: POCT CBC, POCT glycosylated hemoglobin (Hb A1C), POCT glucose (manual entry), POCT SEDIMENTATION RATE, Basic metabolic panel, Thyroid Panel With TSH, ANA, EKG 12-Lead  Nausea without vomiting - Plan: POCT SEDIMENTATION RATE, Basic metabolic panel, Thyroid Panel With TSH, ANA, EKG 12-Lead   Ivar Drape, PA-C [facility] 06/10/2015 11:16 AM

## 2015-06-10 NOTE — Patient Instructions (Addendum)
     IF you received an x-ray today, you will receive an invoice from [facility]. Please contact [facility] at [phone] with questions or concerns regarding your invoice.   IF you received labwork today, you will receive an invoice from [employer]. Please contact [facility] at [phone] with questions or concerns regarding your invoice.   Our billing staff will not be able to assist you with questions regarding bills from these companies.  You will be contacted with the lab results as soon as they are available. The fastest way to get your results is to activate your My Chart account. Instructions are located on the last page of this paperwork. If you have not heard from [place] regarding the results in 2 weeks, please contact this office.     We will review the labs, and follow up.   When your symptoms occur this way, I would like you to recheck your heart rate and blood pressure.   This can be stress related, but we will do some further lab work to narrow this down.

## 2015-06-11 LAB — ANA: Anti Nuclear Antibody(ANA): NEGATIVE

## 2015-06-12 ENCOUNTER — Other Ambulatory Visit: Payer: Self-pay | Admitting: Physician Assistant

## 2015-06-12 DIAGNOSIS — R42 Dizziness and giddiness: Secondary | ICD-10-CM

## 2015-06-12 DIAGNOSIS — R202 Paresthesia of skin: Principal | ICD-10-CM

## 2015-06-12 DIAGNOSIS — R110 Nausea: Secondary | ICD-10-CM

## 2015-06-12 DIAGNOSIS — R200 Anesthesia of skin: Secondary | ICD-10-CM

## 2015-06-12 NOTE — Progress Notes (Unsigned)
Can you add a rheumatoid factor to her current bloodwork??

## 2015-06-15 NOTE — Progress Notes (Signed)
Test added.   

## 2015-06-16 LAB — RHEUMATOID FACTOR: Rhuematoid fact SerPl-aCnc: <10 IU/mL (ref <=14)

## 2015-06-21 DIAGNOSIS — Z713 Dietary counseling and surveillance: Secondary | ICD-10-CM | POA: Diagnosis not present

## 2015-12-23 DIAGNOSIS — Z23 Encounter for immunization: Secondary | ICD-10-CM | POA: Diagnosis not present

## 2015-12-31 DIAGNOSIS — Z713 Dietary counseling and surveillance: Secondary | ICD-10-CM | POA: Diagnosis not present

## 2016-01-07 DIAGNOSIS — Z01419 Encounter for gynecological examination (general) (routine) without abnormal findings: Secondary | ICD-10-CM | POA: Diagnosis not present

## 2016-01-07 DIAGNOSIS — Z124 Encounter for screening for malignant neoplasm of cervix: Secondary | ICD-10-CM | POA: Diagnosis not present

## 2016-01-07 DIAGNOSIS — Z6823 Body mass index (BMI) 23.0-23.9, adult: Secondary | ICD-10-CM | POA: Diagnosis not present

## 2016-01-15 ENCOUNTER — Ambulatory Visit (INDEPENDENT_AMBULATORY_CARE_PROVIDER_SITE_OTHER): Payer: BLUE CROSS/BLUE SHIELD | Admitting: Family Medicine

## 2016-01-15 ENCOUNTER — Encounter: Payer: Self-pay | Admitting: Family Medicine

## 2016-01-15 VITALS — BP 104/62 | HR 122 | Temp 98.1°F | Resp 16 | Ht 66.0 in | Wt 146.4 lb

## 2016-01-15 DIAGNOSIS — Z720 Tobacco use: Secondary | ICD-10-CM

## 2016-01-15 DIAGNOSIS — R000 Tachycardia, unspecified: Secondary | ICD-10-CM

## 2016-01-15 DIAGNOSIS — J329 Chronic sinusitis, unspecified: Secondary | ICD-10-CM | POA: Diagnosis not present

## 2016-01-15 MED ORDER — FLUTICASONE PROPIONATE 50 MCG/ACT NA SUSP: 2.0000 | Freq: Every day | NASAL | 6 refills | Status: DC

## 2016-01-15 MED ORDER — AMOXICILLIN-POT CLAVULANATE 875-125 MG PO TABS: 1.0000 | ORAL_TABLET | Freq: Two times a day (BID) | ORAL | 0 refills | Status: DC

## 2016-01-15 MED ORDER — FLUCONAZOLE 150 MG PO TABS: 150.0000 mg | ORAL_TABLET | Freq: Every day | ORAL | 0 refills | Status: DC

## 2016-01-15 NOTE — Progress Notes (Signed)
Chief Complaint  Patient presents with  . nasal congestion    ears hurt, eyes feel sore    HPI   Upper Respiratory Infection: New Problem  She reports that she has facial pressure, ear pain, pressure behind the eyes, postnasal drip, nasal congestion  No fevers and no cough, no dizziness, no muscle aches. She has been taking a 12 hour sinus medication (generic sudafed) which was working until yesterday She is a smoker who smokes 3-4 cigarettes a day She also has been drinking water and fluids. Onset was 1 week ago  Tachycardia- New Problem Pt has a history of palpitations She reports that she does not feel any palpitations even though her heart rate is so fast She reports that she has been drinking when she is awake but the sudafed helps her congestion and helps her to sleep She denies any dizziness   Past Medical History:  Diagnosis Date  . Dizziness   . Elevated WBC count   . Hyperventilation   . Nausea   . Numbness of feet   . Numbness of hand   . Palpitation   . Racing heart beat   . Thrombocytosis ([place])     Current Outpatient Prescriptions  Medication Sig Dispense Refill  . norethindrone (HEATHER) 0.35 MG tablet Take 1 tablet by mouth daily.    Marland Kitchen amoxicillin-clavulanate (AUGMENTIN) 875-125 MG tablet Take 1 tablet by mouth 2 (two) times daily. 20 tablet 0  . fluconazole (DIFLUCAN) 150 MG tablet Take 1 tablet (150 mg total) by mouth daily. Repeat dose in 3 days. For vaginal yeast or thrush. 2 tablet 0  . fluticasone (FLONASE) 50 MCG/ACT nasal spray Place 2 sprays into both nostrils daily. 16 g 6  . propranolol (INDERAL) 10 MG tablet Take 1 tablet (10 mg total) by mouth 2 (two) times daily. (Patient not taking: Reported on 01/15/2016) 60 tablet 1   No current facility-administered medications for this visit.     Allergies: No Known Allergies  Past Surgical History:  Procedure Laterality Date  . TONSILLECTOMY AND ADENOIDECTOMY      Social History   Social  History  . Marital status: Married    Spouse name: N/A  . Number of children: N/A  . Years of education: N/A   Social History Main Topics  . Smoking status: Current Every Day Smoker    Packs/day: 0.50    Types: Cigarettes  . Smokeless tobacco: Never Used  . Alcohol use 0.0 oz/week     Comment: social  . Drug use: No  . Sexual activity: Yes   Other Topics Concern  . Not on file   Social History Narrative  . No narrative on file    ROS  Objective: Vitals:   01/15/16 0918  BP: 104/62  Pulse: (!) 122  Resp: 16  Temp: 98.1 F (36.7 C)  TempSrc: Oral  SpO2: 96%  Weight: 146 lb 6.4 oz (66.4 kg)  Height: 5\' 6"  (1.676 m)    Physical Exam General: alert, oriented, in NAD Head: normocephalic, atraumatic, +maxillary sinus tenderness Eyes: EOM intact, no scleral icterus or conjunctival injection Ears: TM clear bilaterally Nose: erythema of the nares with exudative discharge Throat: no pharyngeal exudate or erythema Lymph: no posterior auricular, submental or cervical lymph adenopathy Heart: normal rate, normal sinus rhythm, no murmurs Lungs: clear to auscultation bilaterally, no wheezing   Assessment and Plan Cincere was seen today for nasal congestion.  Diagnoses and all orders for this visit:  Tachycardia-  Pt was  advised to stop taking sudafed Advised to increase hydration Stay home from work today to rest  Tobacco abuse- smoking cessation advised Pt instructed that cigarette usage impairs the bodies mucociliary ladder in the respiratory system and leads to more respiratory infections  Rhinosinusitis Will treat with flonase Will also treat with augmentin. Advised probiotic Sent in diflucan to treat thrush or vaginal candidiasis if this occurs -     fluticasone (FLONASE) 50 MCG/ACT nasal spray; Place 2 sprays into both nostrils daily. -     amoxicillin-clavulanate (AUGMENTIN) 875-125 MG tablet; Take 1 tablet by mouth 2 (two) times daily. -     fluconazole  (DIFLUCAN) 150 MG tablet; Take 1 tablet (150 mg total) by mouth daily. Repeat dose in 3 days. For vaginal yeast or thrush.     [place]

## 2016-01-15 NOTE — Patient Instructions (Signed)
     IF you received an x-ray today, you will receive an invoice from [facility]. Please contact [facility] at [phone] with questions or concerns regarding your invoice.   IF you received labwork today, you will receive an invoice from [facility]. Please contact [facility] at [phone] with questions or concerns regarding your invoice.   Our billing staff will not be able to assist you with questions regarding bills from these companies.  You will be contacted with the lab results as soon as they are available. The fastest way to get your results is to activate your My Chart account. Instructions are located on the last page of this paperwork. If you have not heard from us regarding the results in 2 weeks, please contact this office.      

## 2016-03-27 ENCOUNTER — Ambulatory Visit (INDEPENDENT_AMBULATORY_CARE_PROVIDER_SITE_OTHER): Payer: BLUE CROSS/BLUE SHIELD | Admitting: Emergency Medicine

## 2016-03-27 VITALS — BP 122/72 | HR 105 | Temp 98.3°F | Resp 17 | Ht 66.5 in | Wt 151.0 lb

## 2016-03-27 DIAGNOSIS — H6690 Otitis media, unspecified, unspecified ear: Secondary | ICD-10-CM

## 2016-03-27 DIAGNOSIS — H60392 Other infective otitis externa, left ear: Secondary | ICD-10-CM

## 2016-03-27 MED ORDER — NEOMYCIN-POLYMYXIN-HC 3.5-10000-1 OT SOLN: 3.0000 [drp] | Freq: Three times a day (TID) | OTIC | 0 refills | Status: AC

## 2016-03-27 MED ORDER — AZITHROMYCIN 250 MG PO TABS: ORAL_TABLET | ORAL | 0 refills | Status: DC

## 2016-03-27 NOTE — Patient Instructions (Addendum)
     IF you received an x-ray today, you will receive an invoice from [facility]. Please contact [facility] at [phone] with questions or concerns regarding your invoice.   IF you received labwork today, you will receive an invoice from [facility]. Please contact [facility] at [phone] with questions or concerns regarding your invoice.   Our billing staff will not be able to assist you with questions regarding bills from these companies.  You will be contacted with the lab results as soon as they are available. The fastest way to get your results is to activate your My Chart account. Instructions are located on the last page of this paperwork. If you have not heard from [place] regarding the results in 2 weeks, please contact this office.      Otitis Externa Otitis externa is a germ infection in the outer ear. The outer ear is the area from the eardrum to the outside of the ear. Otitis externa is sometimes called "swimmer's ear." HOME CARE  Put drops in the ear as told by your doctor.  Only take medicine as told by your doctor.  If you have diabetes, your doctor may give you more directions. Follow your doctor's directions.  Keep all doctor visits as told. To avoid another infection:  Keep your ear dry. Use the corner of a towel to dry your ear after swimming or bathing.  Avoid scratching or putting things inside your ear.  Avoid swimming in lakes, dirty water, or pools that use a chemical called chlorine poorly.  You may use ear drops after swimming. Combine equal amounts of white vinegar and alcohol in a bottle. Put 3 or 4 drops in each ear. GET HELP IF:   You have a fever.  Your ear is still red, puffy (swollen), or painful after 3 days.  You still have yellowish-white fluid (pus) coming from the ear after 3 days.  Your redness, puffiness, or pain gets worse.  You have a really bad headache.  You have redness, puffiness, pain, or tenderness behind  your ear. MAKE SURE YOU:   Understand these instructions.  Will watch your condition.  Will get help right away if you are not doing well or get worse. This information is not intended to replace advice given to you by your health care provider. Make sure you discuss any questions you have with your health care provider. Document Released: 08/19/2007 Document Revised: 03/23/2014 Document Reviewed: 12/10/2014 Elsevier Interactive Patient Education  2017 [employer].

## 2016-03-27 NOTE — Progress Notes (Signed)
Janice Silva 41 y.o.   Chief Complaint  Patient presents with  . Ear Pain    HISTORY OF PRESENT ILLNESS: This is a 41 y.o. female complaining of left ear pain for 2-3 days.  Otalgia   There is pain in the left ear. This is a new problem. The current episode started in the past 7 days. The problem occurs constantly. The problem has been gradually worsening. There has been no fever. The pain is at a severity of 2/10. The pain is mild. Associated symptoms include hearing loss (hearing change). Pertinent negatives include no coughing, diarrhea, ear discharge, headaches, neck pain, rash, rhinorrhea, sore throat or vomiting. She has tried nothing for the symptoms. There is no history of a chronic ear infection or a tympanostomy tube.     Prior to Admission medications   Medication Sig Start Date End Date Taking? Authorizing Provider  fluticasone (FLONASE) 50 MCG/ACT nasal spray Place 2 sprays into both nostrils daily. 01/15/16  Yes Zoe A Nolon Rod, MD  norethindrone (HEATHER) 0.35 MG tablet Take 1 tablet by mouth daily.   Yes Historical Provider, MD  propranolol (INDERAL) 10 MG tablet Take 1 tablet (10 mg total) by mouth 2 (two) times daily. Patient not taking: Reported on 03/27/2016 09/16/14   Jaynee Eagles, PA-C    No Known Allergies  Patient Active Problem List   Diagnosis Date Noted  . Abnormal EKG 11/19/2014  . Thrombocytosis ([place]) 09/16/2014  . Palpitations 09/16/2014  . Dizziness 09/16/2014    Past Medical History:  Diagnosis Date  . Dizziness   . Elevated WBC count   . Hyperventilation   . Nausea   . Numbness of feet   . Numbness of hand   . Palpitation   . Racing heart beat   . Thrombocytosis ([place])     Past Surgical History:  Procedure Laterality Date  . TONSILLECTOMY AND ADENOIDECTOMY      Social History   Social History  . Marital status: Married    Spouse name: N/A  . Number of children: N/A  . Years of education: N/A   Occupational History  . Not on  file.   Social History Main Topics  . Smoking status: Current Every Day Smoker    Packs/day: 0.50    Types: Cigarettes  . Smokeless tobacco: Never Used  . Alcohol use 0.0 oz/week     Comment: social  . Drug use: No  . Sexual activity: Yes   Other Topics Concern  . Not on file   Social History Narrative  . No narrative on file    Family History  Problem Relation Age of Onset  . Lupus Mother      Review of Systems  Constitutional: Negative for chills and fever.  HENT: Positive for ear pain and hearing loss (hearing change). Negative for ear discharge, rhinorrhea and sore throat.   Eyes: Negative for discharge and redness.  Respiratory: Negative for cough, hemoptysis, shortness of breath and wheezing.   Cardiovascular: Positive for chest pain. Negative for palpitations.  Gastrointestinal: Negative for diarrhea, nausea and vomiting.  Musculoskeletal: Negative for neck pain.  Skin: Negative for rash.  Neurological: Negative for headaches.  Endo/Heme/Allergies: Negative.   Psychiatric/Behavioral: Negative.   All other systems reviewed and are negative.  Vitals:   03/27/16 0840  BP: 122/72  Pulse: (!) 105  Resp: 17  Temp: 98.3 F (36.8 C)     Physical Exam  Constitutional: She is oriented to person, place, and time. She appears well-developed  and well-nourished.  HENT:  Head: Normocephalic and atraumatic.  Right Ear: Tympanic membrane normal.  Left Ear: Tympanic membrane normal.  Erythematous and tender canals L>R; no discharge and TM's appear normal. Earlobes and mastoids WNL.  Eyes: Conjunctivae and EOM are normal. Pupils are equal, round, and reactive to light.  Neck: Normal range of motion. Neck supple.  Cardiovascular: Normal rate and regular rhythm.   Pulmonary/Chest: Effort normal and breath sounds normal.  Musculoskeletal: Normal range of motion.  Neurological: She is alert and oriented to person, place, and time.  Skin: Skin is warm and dry. Capillary  refill takes less than 2 seconds.  Psychiatric: She has a normal mood and affect. Her behavior is normal.  Vitals reviewed.    ASSESSMENT & PLAN: Janice Silva was seen today for ear pain.  Diagnoses and all orders for this visit:  Ear infection  Infective otitis externa of left ear  Other orders -     azithromycin (ZITHROMAX) 250 MG tablet; Sig as indicated -     neomycin-polymyxin-hydrocortisone (CORTISPORIN) otic solution; Place 3 drops into both ears 3 (three) times daily.     Patient Instructions       IF you received an x-ray today, you will receive an invoice from [facility]. Please contact [facility] at [phone] with questions or concerns regarding your invoice.   IF you received labwork today, you will receive an invoice from [facility]. Please contact [facility] at [phone] with questions or concerns regarding your invoice.   Our billing staff will not be able to assist you with questions regarding bills from these companies.  You will be contacted with the lab results as soon as they are available. The fastest way to get your results is to activate your My Chart account. Instructions are located on the last page of this paperwork. If you have not heard from [place] regarding the results in 2 weeks, please contact this office.      Otitis Externa Otitis externa is a germ infection in the outer ear. The outer ear is the area from the eardrum to the outside of the ear. Otitis externa is sometimes called "swimmer's ear." HOME CARE  Put drops in the ear as told by your doctor.  Only take medicine as told by your doctor.  If you have diabetes, your doctor may give you more directions. Follow your doctor's directions.  Keep all doctor visits as told. To avoid another infection:  Keep your ear dry. Use the corner of a towel to dry your ear after swimming or bathing.  Avoid scratching or putting things inside your ear.  Avoid swimming in lakes,  dirty water, or pools that use a chemical called chlorine poorly.  You may use ear drops after swimming. Combine equal amounts of white vinegar and alcohol in a bottle. Put 3 or 4 drops in each ear. GET HELP IF:   You have a fever.  Your ear is still red, puffy (swollen), or painful after 3 days.  You still have yellowish-white fluid (pus) coming from the ear after 3 days.  Your redness, puffiness, or pain gets worse.  You have a really bad headache.  You have redness, puffiness, pain, or tenderness behind your ear. MAKE SURE YOU:   Understand these instructions.  Will watch your condition.  Will get help right away if you are not doing well or get worse. This information is not intended to replace advice given to you by your health care provider. Make sure you  discuss any questions you have with your health care provider. Document Released: 08/19/2007 Document Revised: 03/23/2014 Document Reviewed: 12/10/2014 Elsevier Interactive Patient Education  2017 [employer]vier Inc[employer]      Agustina Caroli, MD [facility]

## 2016-04-07 ENCOUNTER — Ambulatory Visit (INDEPENDENT_AMBULATORY_CARE_PROVIDER_SITE_OTHER): Payer: BLUE CROSS/BLUE SHIELD | Admitting: Family Medicine

## 2016-04-07 VITALS — BP 112/68 | HR 94 | Temp 98.0°F | Resp 18 | Ht 66.5 in | Wt 149.0 lb

## 2016-04-07 DIAGNOSIS — H6591 Unspecified nonsuppurative otitis media, right ear: Secondary | ICD-10-CM

## 2016-04-07 NOTE — Progress Notes (Signed)
Subjective:    Patient ID: Janice Silva, female    DOB: 1975-07-06, 41 y.o.   MRN: [id number]  HPI Janice Silva is a 41 y.o. female  Still feels echo in head as if fluid still stuck in head. Hearing ok, but feels like noise louder. Right worse than left. No significant pain. No qtips or other instrumentation. Treated with azithromycin and cortisporin gtts on 1/12 for otitis externa. Has been using these without difficulty. No other treatments.   No fever.   Slight congestion in sinuses and difficulty "popping ears".  No hearing deficit. No rash.  No earbuds/earplugs at work. No airplane travel or mountain travel recently.   Review of Systems  Constitutional: Negative for chills and fever.  HENT: Positive for congestion and rhinorrhea. Negative for ear discharge, ear pain and hearing loss.   Respiratory: Negative for cough and shortness of breath.   Skin: Negative for rash.   Patient Active Problem List   Diagnosis Date Noted  . Abnormal EKG 11/19/2014  . Thrombocytosis ([place]) 09/16/2014  . Palpitations 09/16/2014   Past Medical History:  Diagnosis Date  . Dizziness   . Elevated WBC count   . Hyperventilation   . Nausea   . Numbness of feet   . Numbness of hand   . Palpitation   . Racing heart beat   . Thrombocytosis ([place])    Past Surgical History:  Procedure Laterality Date  . TONSILLECTOMY AND ADENOIDECTOMY     No Known Allergies Prior to Admission medications   Medication Sig Start Date End Date Taking? Authorizing Provider  norethindrone (HEATHER) 0.35 MG tablet Take 1 tablet by mouth daily.   Yes Historical Provider, MD  propranolol (INDERAL) 10 MG tablet Take 1 tablet (10 mg total) by mouth 2 (two) times daily. 09/16/14  Yes Jaynee Eagles, PA-C  fluticasone (FLONASE) 50 MCG/ACT nasal spray Place 2 sprays into both nostrils daily. Patient not taking: Reported on 04/07/2016 01/15/16   Forrest Moron, MD   Social History   Social History  . Marital status: Married   Spouse name: N/A  . Number of children: N/A  . Years of education: N/A   Occupational History  . Not on file.   Social History Main Topics  . Smoking status: Current Every Day Smoker    Packs/day: 0.50    Types: Cigarettes  . Smokeless tobacco: Never Used  . Alcohol use 0.0 oz/week     Comment: social  . Drug use: No  . Sexual activity: Yes   Other Topics Concern  . Not on file   Social History Narrative  . No narrative on file       Objective:   Physical Exam  Constitutional: She is oriented to person, place, and time. She appears well-developed and well-nourished. No distress.  HENT:  Head: Normocephalic and atraumatic.  Right Ear: Hearing, external ear and ear canal normal. A middle ear effusion (clear fluid base of R TM. No fluid in canal or wall erythema/edema. external ear  ands mastoid NT) is present.  Left Ear: Hearing, external ear and ear canal normal. Tympanic membrane is scarred.  Nose: Nose normal.  Mouth/Throat: Oropharynx is clear and moist. No oropharyngeal exudate.  Eyes: Conjunctivae and EOM are normal. Pupils are equal, round, and reactive to light.  Cardiovascular: Normal rate, regular rhythm, normal heart sounds and intact distal pulses.   No murmur heard. Pulmonary/Chest: Effort normal and breath sounds normal. No respiratory distress. She has no wheezes. She has  no rhonchi.  Neurological: She is alert and oriented to person, place, and time.  Skin: Skin is warm and dry. No rash noted.  Psychiatric: She has a normal mood and affect. Her behavior is normal.  Vitals reviewed.  Vitals:   04/07/16 0759  BP: 112/68  Pulse: 94  Resp: 18  Temp: 98 F (36.7 C)  TempSrc: Oral  SpO2: 98%  Weight: 149 lb (67.6 kg)  Height: 5' 6.5" (1.689 m)      Assessment & Plan:   [name] is a 40 y.o. female OME (otitis media with effusion), right Suspected initial otitis media now with otitis media with effusion. No erythema, denies pain or decreased  hearing. Has received azithromycin, and still within time of treatment.   - Discussed trial of Afrin nasal spray for possible eustachian tube dysfunction component and to help clear effusion. If decreased hearing, pain, or not improving into next week, refer to ENT.   No orders of the defined types were placed in this encounter.  Patient Instructions   Although there is some fluid seen behind your right ear, it doesn't look infected at this time. Additionally the antibiotic prescribed last visit should still be working. You can try over-the-counter Afrin nasal spray once or twice per day for 3 days maximum. If congestion sensation is not improving into next week, I can refer you to Ear nose and throat. If any pain or decreased hearing, let me know and I can refer you sooner.   Barotitis Media Barotitis media is inflammation of your middle ear. This occurs when the auditory tube (eustachian tube) leading from the back of your nose (nasopharynx) to your eardrum is blocked. This blockage may result from a cold, environmental allergies, or an upper respiratory infection. Unresolved barotitis media may lead to damage or hearing loss (barotrauma), which may become permanent. HOME CARE INSTRUCTIONS   Use medicines as recommended by your health care provider. Over-the-counter medicines will help unblock the canal and can help during times of air travel.  Do not put anything into your ears to clean or unplug them. Eardrops will not be helpful.  Do not swim, dive, or fly until your health care provider says it is all right to do so. If these activities are necessary, chewing gum with frequent, forceful swallowing may help. It is also helpful to hold your nose and gently blow to pop your ears for equalizing pressure changes. This forces air into the eustachian tube.  Only take over-the-counter or prescription medicines for pain, discomfort, or fever as directed by your health care provider.  A  decongestant may be helpful in decongesting the middle ear and make pressure equalization easier. SEEK MEDICAL CARE IF:  You experience a serious form of dizziness in which you feel as if the room is spinning and you feel nauseated (vertigo).  Your symptoms only involve one ear. SEEK IMMEDIATE MEDICAL CARE IF:   You develop a severe headache, dizziness, or severe ear pain.  You have bloody or pus-like drainage from your ears.  You develop a fever.  Your problems do not improve or become worse. MAKE SURE YOU:   Understand these instructions.  Will watch your condition.  Will get help right away if you are not doing well or get worse. This information is not intended to replace advice given to you by your health care provider. Make sure you discuss any questions you have with your health care provider. Document Released: 02/28/2000 Document Revised: 12/21/2012 Document Reviewed:  09/27/2012 Elsevier Interactive Patient Education  2017 [place].   IF you received an x-ray today, you will receive an invoice from [facility]. Please contact [facility] at [phone] with questions or concerns regarding your invoice.   IF you received labwork today, you will receive an invoice from [facility]. Please contact [facility] at [phone] with questions or concerns regarding your invoice.   Our billing staff will not be able to assist you with questions regarding bills from these companies.  You will be contacted with the lab results as soon as they are available. The fastest way to get your results is to activate your My Chart account. Instructions are located on the last page of this paperwork. If you have not heard from [place] regarding the results in 2 weeks, please contact this office.     Signed,   Merri Ray, MD [facility] at [facility].  04/07/16 8:32 AM

## 2016-04-07 NOTE — Patient Instructions (Addendum)
Although there is some fluid seen behind your right ear, it doesn't look infected at this time. Additionally the antibiotic prescribed last visit should still be working. You can try over-the-counter Afrin nasal spray once or twice per day for 3 days maximum. If congestion sensation is not improving into next week, I can refer you to Ear nose and throat. If any pain or decreased hearing, let me know and I can refer you sooner.   Barotitis Media Barotitis media is inflammation of your middle ear. This occurs when the auditory tube (eustachian tube) leading from the back of your nose (nasopharynx) to your eardrum is blocked. This blockage may result from a cold, environmental allergies, or an upper respiratory infection. Unresolved barotitis media may lead to damage or hearing loss (barotrauma), which may become permanent. HOME CARE INSTRUCTIONS   Use medicines as recommended by your health care provider. Over-the-counter medicines will help unblock the canal and can help during times of air travel.  Do not put anything into your ears to clean or unplug them. Eardrops will not be helpful.  Do not swim, dive, or fly until your health care provider says it is all right to do so. If these activities are necessary, chewing gum with frequent, forceful swallowing may help. It is also helpful to hold your nose and gently blow to pop your ears for equalizing pressure changes. This forces air into the eustachian tube.  Only take over-the-counter or prescription medicines for pain, discomfort, or fever as directed by your health care provider.  A decongestant may be helpful in decongesting the middle ear and make pressure equalization easier. SEEK MEDICAL CARE IF:  You experience a serious form of dizziness in which you feel as if the room is spinning and you feel nauseated (vertigo).  Your symptoms only involve one ear. SEEK IMMEDIATE MEDICAL CARE IF:   You develop a severe headache, dizziness, or severe  ear pain.  You have bloody or pus-like drainage from your ears.  You develop a fever.  Your problems do not improve or become worse. MAKE SURE YOU:   Understand these instructions.  Will watch your condition.  Will get help right away if you are not doing well or get worse. This information is not intended to replace advice given to you by your health care provider. Make sure you discuss any questions you have with your health care provider. Document Released: 02/28/2000 Document Revised: 12/21/2012 Document Reviewed: 09/27/2012 Elsevier Interactive Patient Education  2017 [employer].   IF you received an x-ray today, you will receive an invoice from [facility]. Please contact [facility] at [phone] with questions or concerns regarding your invoice.   IF you received labwork today, you will receive an invoice from [facility]. Please contact [facility] at [phone] with questions or concerns regarding your invoice.   Our billing staff will not be able to assist you with questions regarding bills from these companies.  You will be contacted with the lab results as soon as they are available. The fastest way to get your results is to activate your My Chart account. Instructions are located on the last page of this paperwork. If you have not heard from [place] regarding the results in 2 weeks, please contact this office.

## 2016-04-25 ENCOUNTER — Encounter: Payer: Self-pay | Admitting: Family Medicine

## 2016-05-01 ENCOUNTER — Ambulatory Visit (INDEPENDENT_AMBULATORY_CARE_PROVIDER_SITE_OTHER): Payer: BLUE CROSS/BLUE SHIELD | Admitting: Physician Assistant

## 2016-05-01 VITALS — BP 104/68 | HR 86 | Temp 98.7°F | Resp 16 | Ht 66.5 in | Wt 151.0 lb

## 2016-05-01 DIAGNOSIS — H9203 Otalgia, bilateral: Secondary | ICD-10-CM

## 2016-05-01 DIAGNOSIS — H6983 Other specified disorders of Eustachian tube, bilateral: Secondary | ICD-10-CM

## 2016-05-01 MED ORDER — PREDNISONE 20 MG PO TABS: 40.0000 mg | ORAL_TABLET | Freq: Every day | ORAL | 0 refills | Status: DC

## 2016-05-01 NOTE — Patient Instructions (Addendum)
Please await contact for the referral for ear nose and throat. You can do the ibuprofen 600mg  every 6 hours.  Ear Barotrauma Introduction Ear barotrauma is injury to the eardrum that is caused by a pressure difference between the inside and the outside of the eardrum. Things that can make this happen include:  Flying in an airplane.  Coming to the surface too quickly after scuba diving.  Going to higher places (high altitudes) quickly.  Having an ear infection.  Being too close to a loud noise.  Being hit hard in the ear.  Working in a pressurized room.  Having swelling (inflammation) in your ear from a cold, an allergy, or an infection. Follow these instructions at home:  Take medicines only as told by your doctor.  Do not do the following until your doctor says it is okay:  Travel to places that are high above sea level.  Work in a pressurized room.  Scuba dive.  Use techniques to help with pressure changes in your ear as told by your doctor. These may include:  Chewing gum.  Swallowing.  Holding your nose and gently blowing to pop your ears.  Keep your ears dry. Use the corner of a towel to gently get water out of your ears after a shower or bath.  Keep all follow-up visits as told by your doctor. This is important. Contact a doctor if:  Your symptoms do not get better, or they get worse.  You have a fever.  Your symptoms affect only one ear. Get help right away if:  You have a very bad headache.  You feel dizzy.  You have very bad ear pain.  You have blood or yellowish-white fluid (pus) coming from your ear.  You have hearing loss.  Your outer ear gets red and swollen.  You have swelling in the area behind your earlobe. This information is not intended to replace advice given to you by your health care provider. Make sure you discuss any questions you have with your health care provider. Document Released: 08/20/2009 Document Revised: 08/08/2015  Document Reviewed: 10/16/2013  2017 Elsevier    IF you received an x-ray today, you will receive an invoice from [facility]. Please contact [facility] at [phone] with questions or concerns regarding your invoice.   IF you received labwork today, you will receive an invoice from [facility]. Please contact [facility] at [phone] with questions or concerns regarding your invoice.   Our billing staff will not be able to assist you with questions regarding bills from these companies.  You will be contacted with the lab results as soon as they are available. The fastest way to get your results is to activate your My Chart account. Instructions are located on the last page of this paperwork. If you have not heard from [place] regarding the results in 2 weeks, please contact this office.

## 2016-05-01 NOTE — Progress Notes (Signed)
[facility] [address] [place] [address] [phone]  Date:  05/01/2016   Name:  Janice Silva   DOB:  [birth year]   MRN:  [id number]  PCP:  No PCP Per Patient    History of Present Illness:  Janice Silva is a 41 y.o. female patient who presents to [facility] for cc of bilateral ear pain. She was seen here 5 weeks ago for ear pain.  This was diagnosed as an ear infection.  She continues to have the ear pain and sensation that fluid is stuck in her ear.  There is pain moreso at the right ear, however at the left as well. She took antibiotic compliantly.  There is no drainage, no fever.  There is mild tinnitus and popping sensation.  This sensation has improved since the last visit, but still present.    Patient Active Problem List   Diagnosis Date Noted  . Abnormal EKG 11/19/2014  . Thrombocytosis ([place]) 09/16/2014  . Palpitations 09/16/2014    Past Medical History:  Diagnosis Date  . Dizziness   . Elevated WBC count   . Hyperventilation   . Nausea   . Numbness of feet   . Numbness of hand   . Palpitation   . Racing heart beat   . Thrombocytosis ([place])     Past Surgical History:  Procedure Laterality Date  . TONSILLECTOMY AND ADENOIDECTOMY      Social History  Substance Use Topics  . Smoking status: Current Every Day Smoker    Packs/day: 0.50    Types: Cigarettes  . Smokeless tobacco: Never Used  . Alcohol use 0.0 oz/week     Comment: social    Family History  Problem Relation Age of Onset  . Lupus Mother     No Known Allergies  Medication list has been reviewed and updated.  Current Outpatient Prescriptions on File Prior to Visit  Medication Sig Dispense Refill  . norethindrone (HEATHER) 0.35 MG tablet Take 1 tablet by mouth daily.    . fluticasone (FLONASE) 50 MCG/ACT nasal spray Place 2 sprays into both nostrils daily. (Patient not taking: Reported on 04/07/2016) 16 g 6  . propranolol (INDERAL) 10 MG tablet Take 1 tablet (10 mg  total) by mouth 2 (two) times daily. (Patient not taking: Reported on 05/01/2016) 60 tablet 1   No current facility-administered medications on file prior to visit.     ROS   Physical Examination: BP 104/68   Pulse 86   Temp 98.7 F (37.1 C) (Oral)   Resp 16   Ht 5' 6.5" (1.689 m)   Wt 151 lb (68.5 kg)   SpO2 100%   BMI 24.01 kg/m  Ideal Body Weight: Weight in (lb) to have BMI = 25: 156.9  Physical Exam  Constitutional: She is oriented to person, place, and time. She appears well-developed and well-nourished. No distress.  HENT:  Head: Normocephalic and atraumatic.  Right Ear: External ear and ear canal normal. Tympanic membrane is retracted. Tympanic membrane is not injected. A middle ear effusion (very minimal) is present.  Left Ear: External ear and ear canal normal. Tympanic membrane is not injected.  Nose: Right sinus exhibits no maxillary sinus tenderness and no frontal sinus tenderness. Left sinus exhibits no maxillary sinus tenderness and no frontal sinus tenderness.  Eyes: Conjunctivae and EOM are normal. Pupils are equal, round, and reactive to light.  Cardiovascular: Normal rate.   Pulmonary/Chest: Effort normal. No respiratory distress.  Neurological: She is  alert and oriented to person, place, and time.  Skin: She is not diaphoretic.  Psychiatric: She has a normal mood and affect. Her behavior is normal.     Assessment and Plan: Janice Silva is a 41 y.o. female who is here today for cc of ear pain. Will treat with steroid at this time.  Will refer to ent. She will use nsaid for pain.  abx likely has cleared infection prior, and more abx seems not advantageous at this time. Eustachian tube dysfunction, bilateral - Plan: predniSONE (DELTASONE) 20 MG tablet  Otalgia of both ears - Plan: predniSONE (DELTASONE) 20 MG tablet  Ivar Drape, PA-C [facility] [place] 2/26/20181:53 PM

## 2016-05-05 ENCOUNTER — Encounter: Payer: Self-pay | Admitting: Physician Assistant

## 2016-05-29 DIAGNOSIS — H5213 Myopia, bilateral: Secondary | ICD-10-CM | POA: Diagnosis not present

## 2016-05-29 DIAGNOSIS — H52222 Regular astigmatism, left eye: Secondary | ICD-10-CM | POA: Diagnosis not present

## 2016-06-11 DIAGNOSIS — Z713 Dietary counseling and surveillance: Secondary | ICD-10-CM | POA: Diagnosis not present

## 2016-07-16 DIAGNOSIS — Z713 Dietary counseling and surveillance: Secondary | ICD-10-CM | POA: Diagnosis not present

## 2016-07-27 DIAGNOSIS — Z713 Dietary counseling and surveillance: Secondary | ICD-10-CM | POA: Diagnosis not present

## 2016-08-07 ENCOUNTER — Encounter: Payer: Self-pay | Admitting: Physician Assistant

## 2016-08-07 ENCOUNTER — Ambulatory Visit (INDEPENDENT_AMBULATORY_CARE_PROVIDER_SITE_OTHER): Payer: BLUE CROSS/BLUE SHIELD | Admitting: Physician Assistant

## 2016-08-07 VITALS — BP 108/73 | HR 88 | Temp 97.9°F | Resp 18 | Ht 66.0 in | Wt 145.6 lb

## 2016-08-07 DIAGNOSIS — L03115 Cellulitis of right lower limb: Secondary | ICD-10-CM | POA: Diagnosis not present

## 2016-08-07 MED ORDER — MUPIROCIN 2 % EX OINT: 1.0000 "application " | TOPICAL_OINTMENT | Freq: Two times a day (BID) | CUTANEOUS | 0 refills | Status: DC

## 2016-08-07 NOTE — Patient Instructions (Addendum)
We recommend that you schedule a mammogram for breast cancer screening. Typically, you do not need a referral to do this. Please contact a local imaging center to schedule your mammogram.  [facility] - [phone]  *ask for the Radiology Department [facility] ([place]) - [phone] or [phone]  [facility] - [phone] [place] [phone] [facility] - [phone]  *ask for the [facility] - [phone]  *ask for the Radiology Department [facility] - [phone]  *ask for the [facility] - [phone]   Please apply the ointment twice per day.  Wash the area with soap and water and pat dry.  Do not use alcohol or hydrogen peroxide on the area.     IF you received an x-ray today, you will receive an invoice from [facility]. Please contact [facility]iology at [phone] with questions or concerns regarding your invoice.   IF you received labwork today, you will receive an invoice from [facility]. Please contact [facility] at [phone] with questions or concerns regarding your invoice.   Our billing staff will not be able to assist you with questions regarding bills from these companies.  You will be contacted with the lab results as soon as they are available. The fastest way to get your results is to activate your My Chart account. Instructions are located on the last page of this paperwork. If you have not heard from [place] regarding the results in 2 weeks, please contact this office.

## 2016-08-09 NOTE — Progress Notes (Signed)
PRIMARY CARE AT [facility] [address], [place] [address] [phone]  Date:  08/07/2016   Name:  Janice Silva   DOB:  08/08/1975   MRN:  [id number]  PCP:  Patient, No Pcp Per    History of Present Illness:  Janice Silva is a 41 y.o. female patient who presents to PCP with  Chief Complaint  Patient presents with  . Rash    x7 days, rash is on the back of the right leg, itchy, redness, bumpy     Rash 7 days ago, started with a brown central scab.  Then the redness around it worsened.  It is very pruritic when she manipulates it, but does not hurt.  Does not recall injury, or bites.  He used neosporin which kind of helped but noticed some tiny bumps spreading around the lesion.  She keeps a bandage on it daily.  Patient Active Problem List   Diagnosis Date Noted  . Abnormal EKG 11/19/2014  . Thrombocytosis ([place]) 09/16/2014  . Palpitations 09/16/2014    Past Medical History:  Diagnosis Date  . Dizziness   . Elevated WBC count   . Hyperventilation   . Nausea   . Numbness of feet   . Numbness of hand   . Palpitation   . Racing heart beat   . Thrombocytosis ([place])     Past Surgical History:  Procedure Laterality Date  . TONSILLECTOMY AND ADENOIDECTOMY      Social History  Substance Use Topics  . Smoking status: Current Every Day Smoker    Packs/day: 0.50    Types: Cigarettes  . Smokeless tobacco: Never Used  . Alcohol use 0.0 oz/week     Comment: social    Family History  Problem Relation Age of Onset  . Lupus Mother     No Known Allergies  Medication list has been reviewed and updated.  Current Outpatient Prescriptions on File Prior to Visit  Medication Sig Dispense Refill  . norethindrone (HEATHER) 0.35 MG tablet Take 1 tablet by mouth daily.    . fluticasone (FLONASE) 50 MCG/ACT nasal spray Place 2 sprays into both nostrils daily. (Patient not taking: Reported on 04/07/2016) 16 g 6  . propranolol (INDERAL) 10 MG tablet Take 1 tablet (10 mg total) by  mouth 2 (two) times daily. (Patient not taking: Reported on 05/01/2016) 60 tablet 1   No current facility-administered medications on file prior to visit.     ROS ROS otherwise unremarkable unless listed above.  Physical Examination: BP 108/73 (BP Location: Right Arm, Patient Position: Sitting, Cuff Size: Normal)   Pulse 88   Temp 97.9 F (36.6 C) (Oral)   Resp 18   Ht 5\' 6"  (1.676 m)   Wt 145 lb 9.6 oz (66 kg)   LMP 07/20/2016   SpO2 97%   BMI 23.50 kg/m  Ideal Body Weight: Weight in (lb) to have BMI = 25: 154.6  Physical Exam  Constitutional: She is oriented to person, place, and time. She appears well-developed and well-nourished. No distress.  HENT:  Head: Normocephalic and atraumatic.  Right Ear: External ear normal.  Left Ear: External ear normal.  Eyes: Conjunctivae and EOM are normal. Pupils are equal, round, and reactive to light.  Cardiovascular: Normal rate.   Pulmonary/Chest: Effort normal. No respiratory distress.  Neurological: She is alert and oriented to person, place, and time.  Skin: She is not diaphoretic.  Central scab with surrounding mild erythema/hy(erpgmentation.  Tiny papule outside the redness.   Psychiatric:  She has a normal mood and affect. Her behavior is normal.     Assessment and Plan: Janice Silva is a 41 y.o. female who is here today for rash Cellulitis of right lower extremity - Plan: mupirocin ointment (BACTROBAN) 2 %  Ivar Drape, PA-C [facility] [place] 5/27/20185:25 PM

## 2016-08-18 DIAGNOSIS — Z713 Dietary counseling and surveillance: Secondary | ICD-10-CM | POA: Diagnosis not present

## 2016-09-30 DIAGNOSIS — Z713 Dietary counseling and surveillance: Secondary | ICD-10-CM | POA: Diagnosis not present

## 2016-11-12 DIAGNOSIS — Z713 Dietary counseling and surveillance: Secondary | ICD-10-CM | POA: Diagnosis not present

## 2016-11-30 ENCOUNTER — Encounter: Payer: Self-pay | Admitting: Physician Assistant

## 2016-11-30 ENCOUNTER — Ambulatory Visit (INDEPENDENT_AMBULATORY_CARE_PROVIDER_SITE_OTHER): Payer: BLUE CROSS/BLUE SHIELD | Admitting: Physician Assistant

## 2016-11-30 VITALS — BP 101/71 | HR 97 | Temp 98.5°F | Resp 16 | Ht 66.0 in | Wt 146.0 lb

## 2016-11-30 DIAGNOSIS — J069 Acute upper respiratory infection, unspecified: Secondary | ICD-10-CM

## 2016-11-30 DIAGNOSIS — R0981 Nasal congestion: Secondary | ICD-10-CM

## 2016-11-30 DIAGNOSIS — J029 Acute pharyngitis, unspecified: Secondary | ICD-10-CM

## 2016-11-30 LAB — POCT RAPID STREP A (OFFICE): Rapid Strep A Screen: NEGATIVE

## 2016-11-30 MED ORDER — GUAIFENESIN ER 1200 MG PO TB12: 1.0000 | ORAL_TABLET | Freq: Two times a day (BID) | ORAL | 1 refills | Status: DC | PRN

## 2016-11-30 MED ORDER — FLUTICASONE PROPIONATE 50 MCG/ACT NA SUSP: 2.0000 | Freq: Every day | NASAL | 12 refills | Status: DC

## 2016-11-30 NOTE — Progress Notes (Signed)
PRIMARY CARE AT [facility] [address] [place] [address] [phone]  Date:  11/30/2016   Name:  Janice Silva   DOB:  03/20/1975   MRN:  [id number]  PCP:  Patient, No Pcp Per    History of Present Illness:  Janice Silva is a 41 y.o. female patient who presents to PCP with  Chief Complaint  Patient presents with  . Sinusitis    x 2 days  . Sore Throat    x 2 days     Throat pain for 2 days.  Ears are pruritic but there is no pain.  There is some pressure along her cheeks.  The mucus is not really thick.  No fever or chills.  No coughing.    Patient Active Problem List   Diagnosis Date Noted  . Abnormal EKG 11/19/2014  . Thrombocytosis ([place]) 09/16/2014  . Palpitations 09/16/2014    Past Medical History:  Diagnosis Date  . Dizziness   . Elevated WBC count   . Hyperventilation   . Nausea   . Numbness of feet   . Numbness of hand   . Palpitation   . Racing heart beat   . Thrombocytosis ([place])     Past Surgical History:  Procedure Laterality Date  . TONSILLECTOMY AND ADENOIDECTOMY      Social History  Substance Use Topics  . Smoking status: Current Every Day Smoker    Packs/day: 0.50    Types: Cigarettes  . Smokeless tobacco: Never Used  . Alcohol use 0.0 oz/week     Comment: social    Family History  Problem Relation Age of Onset  . Lupus Mother     No Known Allergies  Medication list has been reviewed and updated.  Current Outpatient Prescriptions on File Prior to Visit  Medication Sig Dispense Refill  . norethindrone (HEATHER) 0.35 MG tablet Take 1 tablet by mouth daily.    . fluticasone (FLONASE) 50 MCG/ACT nasal spray Place 2 sprays into both nostrils daily. (Patient not taking: Reported on 04/07/2016) 16 g 6  . mupirocin ointment (BACTROBAN) 2 % Apply 1 application topically 2 (two) times daily. (Patient not taking: Reported on 11/30/2016) 22 g 0  . propranolol (INDERAL) 10 MG tablet Take 1 tablet (10 mg total) by mouth 2 (two) times daily.  (Patient not taking: Reported on 05/01/2016) 60 tablet 1   No current facility-administered medications on file prior to visit.     ROS ROS otherwise unremarkable unless listed above.  Physical Examination: BP 101/71   Pulse 97   Temp 98.5 F (36.9 C) (Oral)   Resp 16   Ht 5\' 6"  (1.676 m)   Wt 146 lb (66.2 kg)   LMP 11/16/2016   SpO2 97%   BMI 23.57 kg/m  Ideal Body Weight: Weight in (lb) to have BMI = 25: 154.6  Physical Exam  Constitutional: She is oriented to person, place, and time. She appears well-developed and well-nourished. No distress.  HENT:  Head: Normocephalic and atraumatic.  Right Ear: Tympanic membrane, external ear and ear canal normal.  Left Ear: Tympanic membrane, external ear and ear canal normal.  Nose: Mucosal edema and rhinorrhea present. Right sinus exhibits no maxillary sinus tenderness and no frontal sinus tenderness. Left sinus exhibits no maxillary sinus tenderness and no frontal sinus tenderness.  Mouth/Throat: No uvula swelling. No oropharyngeal exudate, posterior oropharyngeal edema or posterior oropharyngeal erythema.  Eyes: Pupils are equal, round, and reactive to light. Conjunctivae and EOM are normal.  Cardiovascular:  Normal rate, regular rhythm, normal heart sounds and intact distal pulses.  Exam reveals no gallop, no distant heart sounds and no friction rub.   No murmur heard. Pulmonary/Chest: Effort normal. No respiratory distress. She has no decreased breath sounds. She has no wheezes. She has no rhonchi.  Lymphadenopathy:       Head (right side): No submandibular, no tonsillar, no preauricular and no posterior auricular adenopathy present.       Head (left side): No submandibular, no tonsillar, no preauricular and no posterior auricular adenopathy present.  Neurological: She is alert and oriented to person, place, and time.  Skin: Skin is warm and dry. Capillary refill takes less than 2 seconds. She is not diaphoretic.  Psychiatric: She  has a normal mood and affect. Her behavior is normal.     Assessment and Plan: Janice Silva is a 41 y.o. female who is here today for cc of  Chief Complaint  Patient presents with  . Sinusitis    x 2 days  . Sore Throat    x 2 days  8 days worsening symptoms, consider giving augmentin 875-125 bid for 7 days.  Acute upper respiratory infection - Plan: Guaifenesin (MUCINEX MAXIMUM STRENGTH) 1200 MG TB12, fluticasone (FLONASE) 50 MCG/ACT nasal spray  Sore throat - Plan: POCT rapid strep A, Guaifenesin (MUCINEX MAXIMUM STRENGTH) 1200 MG TB12, fluticasone (FLONASE) 50 MCG/ACT nasal spray  Sinus congestion - Plan: Guaifenesin (MUCINEX MAXIMUM STRENGTH) 1200 MG TB12, fluticasone (FLONASE) 50 MCG/ACT nasal spray  Ivar Drape, PA-C [facility] 9/20/201810:34 AM

## 2016-11-30 NOTE — Patient Instructions (Addendum)
Please hydrate well with 64 oz of water. You can use nasal saline to rinse out the nostrils. I would like you to gargle with warm salt water.   Please let me know if your symptoms do not improve within 7 days.   Upper Respiratory Infection, Adult Most upper respiratory infections (URIs) are caused by a virus. A URI affects the nose, throat, and upper air passages. The most common type of URI is often called "the common cold." Follow these instructions at home:  Take medicines only as told by your doctor.  Gargle warm saltwater or take cough drops to comfort your throat as told by your doctor.  Use a warm mist humidifier or inhale steam from a shower to increase air moisture. This may make it easier to breathe.  Drink enough fluid to keep your pee (urine) clear or pale yellow.  Eat soups and other clear broths.  Have a healthy diet.  Rest as needed.  Go back to work when your fever is gone or your doctor says it is okay. ? You may need to stay home longer to avoid giving your URI to others. ? You can also wear a face mask and wash your hands often to prevent spread of the virus.  Use your inhaler more if you have asthma.  Do not use any tobacco products, including cigarettes, chewing tobacco, or electronic cigarettes. If you need help quitting, ask your doctor. Contact a doctor if:  You are getting worse, not better.  Your symptoms are not helped by medicine.  You have chills.  You are getting more short of breath.  You have brown or red mucus.  You have yellow or brown discharge from your nose.  You have pain in your face, especially when you bend forward.  You have a fever.  You have puffy (swollen) neck glands.  You have pain while swallowing.  You have white areas in the back of your throat. Get help right away if:  You have very bad or constant: ? Headache. ? Ear pain. ? Pain in your forehead, behind your eyes, and over your cheekbones (sinus  pain). ? Chest pain.  You have long-lasting (chronic) lung disease and any of the following: ? Wheezing. ? Long-lasting cough. ? Coughing up blood. ? A change in your usual mucus.  You have a stiff neck.  You have changes in your: ? Vision. ? Hearing. ? Thinking. ? Mood. This information is not intended to replace advice given to you by your health care provider. Make sure you discuss any questions you have with your health care provider. Document Released: 08/19/2007 Document Revised: 11/03/2015 Document Reviewed: 06/07/2013 Elsevier Interactive Patient Education  2018 [employer].     IF you received an x-ray today, you will receive an invoice from [facility]. Please contact [facility] at [phone] with questions or concerns regarding your invoice.   IF you received labwork today, you will receive an invoice from [facility]. Please contact [facility] at [phone] with questions or concerns regarding your invoice.   Our billing staff will not be able to assist you with questions regarding bills from these companies.  You will be contacted with the lab results as soon as they are available. The fastest way to get your results is to activate your My Chart account. Instructions are located on the last page of this paperwork. If you have not heard from [place] regarding the results in 2 weeks, please contact this office.

## 2016-12-31 DIAGNOSIS — Z713 Dietary counseling and surveillance: Secondary | ICD-10-CM | POA: Diagnosis not present

## 2017-01-12 DIAGNOSIS — Z124 Encounter for screening for malignant neoplasm of cervix: Secondary | ICD-10-CM | POA: Diagnosis not present

## 2017-01-12 DIAGNOSIS — Z01419 Encounter for gynecological examination (general) (routine) without abnormal findings: Secondary | ICD-10-CM | POA: Diagnosis not present

## 2017-01-12 DIAGNOSIS — Z6824 Body mass index (BMI) 24.0-24.9, adult: Secondary | ICD-10-CM | POA: Diagnosis not present

## 2017-01-12 LAB — HM PAP SMEAR: HM Pap smear: NEGATIVE

## 2017-01-13 DIAGNOSIS — Z23 Encounter for immunization: Secondary | ICD-10-CM | POA: Diagnosis not present

## 2017-02-11 DIAGNOSIS — Z713 Dietary counseling and surveillance: Secondary | ICD-10-CM | POA: Diagnosis not present

## 2017-04-01 DIAGNOSIS — Z713 Dietary counseling and surveillance: Secondary | ICD-10-CM | POA: Diagnosis not present

## 2017-05-20 DIAGNOSIS — Z713 Dietary counseling and surveillance: Secondary | ICD-10-CM | POA: Diagnosis not present

## 2017-06-14 DIAGNOSIS — Z713 Dietary counseling and surveillance: Secondary | ICD-10-CM | POA: Diagnosis not present

## 2017-06-23 ENCOUNTER — Encounter: Payer: Self-pay | Admitting: Physician Assistant

## 2017-07-15 DIAGNOSIS — Z713 Dietary counseling and surveillance: Secondary | ICD-10-CM | POA: Diagnosis not present

## 2017-08-26 DIAGNOSIS — Z713 Dietary counseling and surveillance: Secondary | ICD-10-CM | POA: Diagnosis not present

## 2017-10-07 DIAGNOSIS — Z713 Dietary counseling and surveillance: Secondary | ICD-10-CM | POA: Diagnosis not present

## 2017-11-18 DIAGNOSIS — Z713 Dietary counseling and surveillance: Secondary | ICD-10-CM | POA: Diagnosis not present

## 2017-11-23 DIAGNOSIS — Z23 Encounter for immunization: Secondary | ICD-10-CM | POA: Diagnosis not present

## 2017-11-25 ENCOUNTER — Encounter: Payer: Self-pay | Admitting: Family Medicine

## 2017-11-25 ENCOUNTER — Other Ambulatory Visit: Payer: Self-pay

## 2017-11-25 ENCOUNTER — Ambulatory Visit: Payer: BLUE CROSS/BLUE SHIELD | Admitting: Family Medicine

## 2017-11-25 VITALS — BP 105/72 | HR 63 | Temp 97.7°F | Ht 65.5 in | Wt 150.6 lb

## 2017-11-25 DIAGNOSIS — H6992 Unspecified Eustachian tube disorder, left ear: Secondary | ICD-10-CM | POA: Diagnosis not present

## 2017-11-25 MED ORDER — FLUTICASONE PROPIONATE 50 MCG/ACT NA SUSP: 1.0000 | Freq: Two times a day (BID) | NASAL | 6 refills | Status: DC

## 2017-11-25 NOTE — Patient Instructions (Addendum)
   If you have lab work done today you will be contacted with your lab results within the next 2 weeks.  If you have not heard from us then please contact us. The fastest way to get your results is to register for My Chart.   IF you received an x-ray today, you will receive an invoice from [facility]. Please contact [facility] at [phone] with questions or concerns regarding your invoice.   IF you received labwork today, you will receive an invoice from [facility]. Please contact [facility] at [phone] with questions or concerns regarding your invoice.   Our billing staff will not be able to assist you with questions regarding bills from these companies.  You will be contacted with the lab results as soon as they are available. The fastest way to get your results is to activate your My Chart account. Instructions are located on the last page of this paperwork. If you have not heard from us regarding the results in 2 weeks, please contact this office.      Eustachian Tube Dysfunction The eustachian tube connects the middle ear to the back of the nose. It regulates air pressure in the middle ear by allowing air to move between the ear and nose. It also helps to drain fluid from the middle ear space. When the eustachian tube does not function properly, air pressure, fluid, or both can build up in the middle ear. Eustachian tube dysfunction can affect one or both ears. What are the causes? This condition happens when the eustachian tube becomes blocked or cannot open normally. This may result from:  Ear infections.  Colds and other upper respiratory infections.  Allergies.  Irritation, such as from cigarette smoke or acid from the stomach coming up into the esophagus (gastroesophageal reflux).  Sudden changes in air pressure, such as from descending in an airplane.  Abnormal growths in the nose or throat, such as nasal polyps, tumors, or enlarged tissue at the  back of the throat (adenoids).  What increases the risk? This condition may be more likely to develop in people who smoke and people who are overweight. Eustachian tube dysfunction may also be more likely to develop in children, especially children who have:  Certain birth defects of the mouth, such as cleft palate.  Large tonsils and adenoids.  What are the signs or symptoms? Symptoms of this condition may include:  A feeling of fullness in the ear.  Ear pain.  Clicking or popping noises in the ear.  Ringing in the ear.  Hearing loss.  Loss of balance.  Symptoms may get worse when the air pressure around you changes, such as when you travel to an area of high elevation or fly on an airplane. How is this diagnosed? This condition may be diagnosed based on:  Your symptoms.  A physical exam of your ear, nose, and throat.  Tests, such as those that measure: ? The movement of your eardrum (tympanogram). ? Your hearing (audiometry).  How is this treated? Treatment depends on the cause and severity of your condition. If your symptoms are mild, you may be able to relieve your symptoms by moving air into ("popping") your ears. If you have symptoms of fluid in your ears, treatment may include:  Decongestants.  Antihistamines.  Nasal sprays or ear drops that contain medicines that reduce swelling (steroids).  In some cases, you may need to have a procedure to drain the fluid in your eardrum (myringotomy). In this procedure,   a small tube is placed in the eardrum to:  Drain the fluid.  Restore the air in the middle ear space.  Follow these instructions at home:  Take over-the-counter and prescription medicines only as told by your health care provider.  Use techniques to help pop your ears as recommended by your health care provider. These may include: ? Chewing gum. ? Yawning. ? Frequent, forceful swallowing. ? Closing your mouth, holding your nose closed, and gently  blowing as if you are trying to blow air out of your nose.  Do not do any of the following until your health care provider approves: ? Travel to high altitudes. ? Fly in airplanes. ? Work in a pressurized cabin or room. ? Scuba dive.  Keep your ears dry. Dry your ears completely after showering or bathing.  Do not smoke.  Keep all follow-up visits as told by your health care provider. This is important. Contact a health care provider if:  Your symptoms do not go away after treatment.  Your symptoms come back after treatment.  You are unable to pop your ears.  You have: ? A fever. ? Pain in your ear. ? Pain in your head or neck. ? Fluid draining from your ear.  Your hearing suddenly changes.  You become very dizzy.  You lose your balance. This information is not intended to replace advice given to you by your health care provider. Make sure you discuss any questions you have with your health care provider. Document Released: 03/29/2015 Document Revised: 08/08/2015 Document Reviewed: 03/21/2014 Elsevier Interactive Patient Education  2018 Elsevier Inc.  

## 2017-11-25 NOTE — Progress Notes (Signed)
9/12/20191:51 PM  Janice Silva 1975-04-25, 42 y.o. female [id number]  Chief Complaint  Patient presents with  . Otalgia    in both ears, taking otc for the pain    HPI:   Patient is a 42 y.o. female who presents today for mostly left ear pain for past 5 days  Denies any ear redness, warmth or drainage.  Hearing intact. Some mild intermittent dizziness Mild sinus pressure, no nasal congestion, mild PND and scratchy throat No cough or SOB No fever or chills No jaw pain or pain with chewing   Fall Risk  11/25/2017 11/30/2016 08/07/2016 05/01/2016 04/07/2016  Falls in the past year? No No No No No     Depression screen [facility] 2/9 11/25/2017 11/30/2016 08/07/2016  Decreased Interest 0 0 0  Down, Depressed, Hopeless 0 0 0  PHQ - 2 Score 0 0 0    No Known Allergies  Prior to Admission medications   Medication Sig Start Date End Date Taking? Authorizing Provider  fluticasone (FLONASE) 50 MCG/ACT nasal spray Place 2 sprays into both nostrils daily. Patient not taking: Reported on 11/25/2017 11/30/16   Ivar Drape D, PA  Guaifenesin [facility] MAXIMUM STRENGTH) 1200 MG TB12 Take 1 tablet (1,200 mg total) by mouth every 12 (twelve) hours as needed. Patient not taking: Reported on 11/25/2017 11/30/16   Ivar Drape D, PA  mupirocin ointment (BACTROBAN) 2 % Apply 1 application topically 2 (two) times daily. Patient not taking: Reported on 11/30/2016 08/07/16   Ivar Drape D, PA  norethindrone (HEATHER) 0.35 MG tablet Take 1 tablet by mouth daily.    [provider]  norethindrone-ethinyl estradiol (OVCON-35, 28,) 0.4-35 MG-MCG tablet Ovcon-35 (28)    [provider]  propranolol (INDERAL) 10 MG tablet Take 1 tablet (10 mg total) by mouth 2 (two) times daily. Patient not taking: Reported on 05/01/2016 09/16/14   Jaynee Eagles, PA-C    Past Medical History:  Diagnosis Date  . Dizziness   . Elevated WBC count   . Hyperventilation   . Nausea   . Numbness of  feet   . Numbness of hand   . Palpitation   . Racing heart beat   . Thrombocytosis ([place])     Past Surgical History:  Procedure Laterality Date  . TONSILLECTOMY AND ADENOIDECTOMY      Social History   Tobacco Use  . Smoking status: Current Every Day Smoker    Packs/day: 0.50    Types: Cigarettes  . Smokeless tobacco: Never Used  Substance Use Topics  . Alcohol use: Yes    Alcohol/week: 0.0 standard drinks    Comment: social    Family History  Problem Relation Age of Onset  . Lupus Mother     ROS Per hpi  OBJECTIVE:  Blood pressure 105/72, pulse 63, temperature 97.7 F (36.5 C), temperature source Oral, height 5' 5.5" (1.664 m), weight 150 lb 9.6 oz (68.3 kg), last menstrual period 11/22/2017, SpO2 98 %. Body mass index is 24.68 kg/m.   Physical Exam  Constitutional: She is oriented to person, place, and time. She appears well-developed and well-nourished.  HENT:  Head: Normocephalic and atraumatic.  Right Ear: Hearing, tympanic membrane, external ear and ear canal normal.  Left Ear: Hearing, external ear and ear canal normal. Tympanic membrane is retracted. Tympanic membrane is not erythematous.  No middle ear effusion.  Nose: Mucosal edema present. No rhinorrhea. Right sinus exhibits no maxillary sinus tenderness and no frontal sinus tenderness. Left sinus exhibits no maxillary  sinus tenderness and no frontal sinus tenderness.  Mouth/Throat: Oropharynx is clear and moist.  Eyes: Pupils are equal, round, and reactive to light. Conjunctivae and EOM are normal.  Neck: Neck supple.  Cardiovascular: Normal rate, regular rhythm and normal heart sounds. Exam reveals no gallop and no friction rub.  No murmur heard. Pulmonary/Chest: Effort normal and breath sounds normal. She has no wheezes. She has no rales.  Musculoskeletal: She exhibits no edema.  Lymphadenopathy:    She has no cervical adenopathy.  Neurological: She is alert and oriented to person, place, and time.    Skin: Skin is warm and dry.  Psychiatric: She has a normal mood and affect.  Nursing note and vitals reviewed.   ASSESSMENT and PLAN  1. Eustachian tube disorder, left Discussed supportive measures, new meds r/se/b and RTC precautions. Patient educational handout given. Other orders - fluticasone (FLONASE) 50 MCG/ACT nasal spray; Place 1 spray into both nostrils 2 (two) times daily.  Return if symptoms worsen or fail to improve.    Rutherford Guys, MD Primary Care at [facility] [place], [place] [address] Ph.  [phone] Fax [phone]

## 2017-12-01 NOTE — Progress Notes (Signed)
MRN: [id number] DOB: 05/27/1975  Subjective:   Janice Silva is a 42 y.o. female presenting for chief complaint of Ear Pain (still having ear aches); Headache; and Sore Throat (throat feels swollen ) .  Reports ~2 week history of b/l ear pressure. Has associated scratchy throat, which she developed a few days ago. Has some nasal congestion. Was seen in office 1 week ago and dx with ETD . Has tried sudafed consistently for a couple of weeks and just started flonase a week ago with some relief.  Felt dizzy once when she turned her head quickly a few days ago, has not occurred since. Denies fever, sinus pain, rhinorrhea, itchy watery eyes, wheezing, shortness of breath, chest pain, myalgia and cough, night sweats, chills, fatigue, malaise, nausea, vomiting, abdominal pain and diarrhea.  No history of seasonal allergies that she is aware of.  Smokes cigarettes occasionally.  Denies any other aggravating or relieving factors, no other questions or concerns.  Janice Silva has a current medication list which includes the following prescription(s): fluticasone, norethindrone, and norethindrone-ethinyl estradiol. Also has No Known Allergies.  Janice Silva  has a past medical history of Dizziness, Elevated WBC count, Hyperventilation, Nausea, Numbness of feet, Numbness of hand, Palpitation, Racing heart beat, and Thrombocytosis ([place]). Also  has a past surgical history that includes Tonsillectomy and adenoidectomy.   Objective:   Vitals: BP 116/70   Pulse (!) 112   Temp 98.7 F (37.1 C) (Oral)   Resp 18   Ht 5' 5.5" (1.664 m)   Wt 148 lb 9.6 oz (67.4 kg)   LMP 11/22/2017   SpO2 97%   BMI 24.35 kg/m   Physical Exam  Constitutional: She is oriented to person, place, and time. She appears well-developed and well-nourished. No distress.  HENT:  Head: Normocephalic and atraumatic.  Right Ear: Tympanic membrane, external ear and ear canal normal.  Left Ear: External ear and ear canal normal. Tympanic  membrane is scarred. Tympanic membrane is not retracted and not bulging. A middle ear effusion is present.  Nose: Mucosal edema (mild b/l) present. Right sinus exhibits no maxillary sinus tenderness and no frontal sinus tenderness. Left sinus exhibits no maxillary sinus tenderness and no frontal sinus tenderness.  Mouth/Throat: Uvula is midline and mucous membranes are normal. Posterior oropharyngeal erythema (cobblestoning noted) present. No posterior oropharyngeal edema or tonsillar abscesses. No tonsillar exudate.  Eyes: Conjunctivae are normal.  Neck: Normal range of motion.  Cardiovascular: Normal rate, regular rhythm, normal heart sounds and intact distal pulses.  Pulmonary/Chest: Effort normal.  Lymphadenopathy:       Head (right side): No submental, no submandibular, no tonsillar, no preauricular, no posterior auricular and no occipital adenopathy present.       Head (left side): No submental, no submandibular, no tonsillar, no preauricular, no posterior auricular and no occipital adenopathy present.    She has no cervical adenopathy.       Right: No supraclavicular adenopathy present.       Left: No supraclavicular adenopathy present.  Neurological: She is alert and oriented to person, place, and time.  Skin: Skin is warm and dry.  Psychiatric: She has a normal mood and affect.  Vitals reviewed.   No results found for this or any previous visit (from the past 24 hour(s)).  Assessment and Plan :  1. Eustachian tube dysfunction, bilateral 2. Acute upper respiratory infection Hx and PE findings consistent with continued ETD and recent onset viral URI vs new onset allergic rhinitis. Advised supportive  care, offered symptomatic relief. Return to clinic if symptoms worsen or fail to improve in 5-7 days, otherwise return to clinic as needed.    Tenna Delaine, PA-C  Primary Care at [facility] 12/02/2017 11:56 AM

## 2017-12-02 ENCOUNTER — Other Ambulatory Visit: Payer: Self-pay

## 2017-12-02 ENCOUNTER — Encounter: Payer: Self-pay | Admitting: Physician Assistant

## 2017-12-02 ENCOUNTER — Ambulatory Visit: Payer: BLUE CROSS/BLUE SHIELD | Admitting: Physician Assistant

## 2017-12-02 VITALS — BP 116/70 | HR 112 | Temp 98.7°F | Resp 18 | Ht 65.5 in | Wt 148.6 lb

## 2017-12-02 DIAGNOSIS — H6983 Other specified disorders of Eustachian tube, bilateral: Secondary | ICD-10-CM

## 2017-12-02 DIAGNOSIS — J069 Acute upper respiratory infection, unspecified: Secondary | ICD-10-CM | POA: Diagnosis not present

## 2017-12-02 NOTE — Patient Instructions (Addendum)
Pick up over-the-counter Afrin and Zyrtec.  Take Afrin for the next 3 days along with Flonase.  You may use Zyrtec daily.  I recommend using ibuprofen 600 to 800 mg every 6-8 hours as needed for sore throat.  I do recommend using this pretty consistently over the next 3 days.  He may also want to use over-the-counter nasal saline rinses at nighttime.  If you start to have dizziness, recommend over-the-counter meclizine.  If you start to have worsening congestion, sinus pressure, sinus pain, visual disturbance, nausea, vomiting, fever, chills please seek care immediately.   If you have lab work done today you will be contacted with your lab results within the next 2 weeks.  If you have not heard from [place] then please contact us. The fastest way to get your results is to register for My Chart.   Eustachian Tube Dysfunction The eustachian tube connects the middle ear to the back of the nose. It regulates air pressure in the middle ear by allowing air to move between the ear and nose. It also helps to drain fluid from the middle ear space. When the eustachian tube does not function properly, air pressure, fluid, or both can build up in the middle ear. Eustachian tube dysfunction can affect one or both ears. What are the causes? This condition happens when the eustachian tube becomes blocked or cannot open normally. This may result from:  Ear infections.  Colds and other upper respiratory infections.  Allergies.  Irritation, such as from cigarette smoke or acid from the stomach coming up into the esophagus (gastroesophageal reflux).  Sudden changes in air pressure, such as from descending in an airplane.  Abnormal growths in the nose or throat, such as nasal polyps, tumors, or enlarged tissue at the back of the throat (adenoids).  What increases the risk? This condition may be more likely to develop in people who smoke and people who are overweight. Eustachian tube dysfunction may also be  more likely to develop in children, especially children who have:  Certain birth defects of the mouth, such as cleft palate.  Large tonsils and adenoids.  What are the signs or symptoms? Symptoms of this condition may include:  A feeling of fullness in the ear.  Ear pain.  Clicking or popping noises in the ear.  Ringing in the ear.  Hearing loss.  Loss of balance.  Symptoms may get worse when the air pressure around you changes, such as when you travel to an area of high elevation or fly on an airplane. How is this diagnosed? This condition may be diagnosed based on:  Your symptoms.  A physical exam of your ear, nose, and throat.  Tests, such as those that measure: ? The movement of your eardrum (tympanogram). ? Your hearing (audiometry).  How is this treated? Treatment depends on the cause and severity of your condition. If your symptoms are mild, you may be able to relieve your symptoms by moving air into ("popping") your ears. If you have symptoms of fluid in your ears, treatment may include:  Decongestants.  Antihistamines.  Nasal sprays or ear drops that contain medicines that reduce swelling (steroids).  In some cases, you may need to have a procedure to drain the fluid in your eardrum (myringotomy). In this procedure, a small tube is placed in the eardrum to:  Drain the fluid.  Restore the air in the middle ear space.  Follow these instructions at home:  Take over-the-counter and prescription medicines only as  told by your health care provider.  Use techniques to help pop your ears as recommended by your health care provider. These may include: ? Chewing gum. ? Yawning. ? Frequent, forceful swallowing. ? Closing your mouth, holding your nose closed, and gently blowing as if you are trying to blow air out of your nose.  Do not do any of the following until your health care provider approves: ? Travel to high altitudes. ? Fly in airplanes. ? Work in a  Pension scheme manager or room. ? Scuba dive.  Keep your ears dry. Dry your ears completely after showering or bathing.  Do not smoke.  Keep all follow-up visits as told by your health care provider. This is important. Contact a health care provider if:  Your symptoms do not go away after treatment.  Your symptoms come back after treatment.  You are unable to pop your ears.  You have: ? A fever. ? Pain in your ear. ? Pain in your head or neck. ? Fluid draining from your ear.  Your hearing suddenly changes.  You become very dizzy.  You lose your balance. This information is not intended to replace advice given to you by your health care provider. Make sure you discuss any questions you have with your health care provider. Document Released: 03/29/2015 Document Revised: 08/08/2015 Document Reviewed: 03/21/2014 Elsevier Interactive Patient Education  2018 [employer].  IF you received an x-ray today, you will receive an invoice from [facility]. Please contact [facility] at [phone] with questions or concerns regarding your invoice.   IF you received labwork today, you will receive an invoice from [place]. Please contact [facility] at [phone] with questions or concerns regarding your invoice.   Our billing staff will not be able to assist you with questions regarding bills from these companies.  You will be contacted with the lab results as soon as they are available. The fastest way to get your results is to activate your My Chart account. Instructions are located on the last page of this paperwork. If you have not heard from [place] regarding the results in 2 weeks, please contact this office.

## 2017-12-09 ENCOUNTER — Ambulatory Visit: Payer: Self-pay | Admitting: Family Medicine

## 2017-12-20 NOTE — Progress Notes (Signed)
Subjective:    Patient ID: Janice Silva, female    DOB: [birth year], 42 y.o.   MRN: [id number]  HPI:  Janice Silva is here to establish as a new pt. She is a pleasant 42 year old female. PMH: Intermittent dizziness the last 12 months that has increased in frequency and intensity in last 5 weeks.  She was seen in [facility] believing that sx's were r/t to "clogged ears" and instructed to take OTC Sudafed, which only slightly reduced sx's. She was seen and adjusted by chiropractor 3 times the last two weeks and reports slight reduction in dizziness, estimates will occur every "few days now". She denies dizziness occurring at any specific time of day.  She denies hx of head injury or vertigo. She denies personal/family hx of neurological disorders. She estimates to drink 40-60 oz water/day and tries to "eat as healthy as I can". She walks daily and has started [employer]" on YouTube She uses tobacco- 1/4 pack/day,  for >25 years- she is agreeable to smoking cessation. She is married with two daughters-ages 22,17  Patient Care Team    Relationship Specialty Notifications Start End  Rives, Berna Spare, NP PCP - General Family Medicine  12/21/17     Patient Active Problem List   Diagnosis Date Noted  . Healthcare maintenance 12/21/2017  . Encounter for tobacco use cessation counseling 12/21/2017  . Abnormal EKG 11/19/2014  . Thrombocytosis ([place]) 09/16/2014  . Palpitations 09/16/2014     Past Medical History:  Diagnosis Date  . Dizziness   . Elevated WBC count   . Hyperventilation   . Nausea   . Numbness of feet   . Numbness of hand   . Palpitation   . Racing heart beat   . Thrombocytosis ([place])      Past Surgical History:  Procedure Laterality Date  . MYRINGOTOMY WITH TUBE PLACEMENT    . TONSILLECTOMY AND ADENOIDECTOMY       Family History  Problem Relation Age of Onset  . Lupus Mother   . Healthy Father   . Healthy Sister   . Healthy Daughter   . Healthy Daughter       Social History   Substance and Sexual Activity  Drug Use No     Social History   Substance and Sexual Activity  Alcohol Use Yes  . Alcohol/week: 0.0 standard drinks   Comment: social     Social History   Tobacco Use  Smoking Status Current Some Day Smoker  . Packs/day: 0.25  . Years: 25.00  . Pack years: 6.25  . Types: Cigarettes  Smokeless Tobacco Never Used     Outpatient Encounter Medications as of 12/21/2017  Medication Sig  . ferrous sulfate 325 (65 FE) MG tablet Take 325 mg by mouth daily with breakfast.  . norethindrone-ethinyl estradiol (OVCON-35, 28,) 0.4-35 MG-MCG tablet Ovcon-35 (28)  . vitamin B-12 (CYANOCOBALAMIN) 500 MCG tablet Take 500 mcg by mouth daily.  Marland Kitchen buPROPion (WELLBUTRIN SR) 150 MG 12 hr tablet 1 tab daily for three days then increase to one tab twice daily  . [DISCONTINUED] fluticasone (FLONASE) 50 MCG/ACT nasal spray Place 1 spray into both nostrils 2 (two) times daily.  . [DISCONTINUED] norethindrone (HEATHER) 0.35 MG tablet Take 1 tablet by mouth daily.   No facility-administered encounter medications on file as of 12/21/2017.     Allergies: Patient has no known allergies.  Body mass index is 24.61 kg/m.  Blood pressure 107/74, pulse 85, height 5' 5.5" (1.664 m),  weight 150 lb 3.2 oz (68.1 kg), last menstrual period 11/22/2017, SpO2 97 %.  Review of Systems  Constitutional: Positive for fatigue. Negative for activity change, appetite change, chills, diaphoresis, fever and unexpected weight change.  HENT: Negative for congestion.   Eyes: Negative for visual disturbance.  Respiratory: Negative for cough, chest tightness, shortness of breath, wheezing and stridor.   Cardiovascular: Negative for chest pain, palpitations and leg swelling.  Genitourinary: Negative for difficulty urinating and flank pain.  Musculoskeletal: Negative for arthralgias, back pain, gait problem, joint swelling, myalgias, neck pain and neck stiffness.  Skin:  Negative for color change, pallor, rash and wound.  Neurological: Positive for dizziness. Negative for headaches.  Hematological: Does not bruise/bleed easily.  Psychiatric/Behavioral: Negative for behavioral problems, confusion, decreased concentration, dysphoric mood, hallucinations, self-injury, sleep disturbance and suicidal ideas. The patient is not nervous/anxious and is not hyperactive.        Objective:   Physical Exam  Constitutional: She is oriented to person, place, and time. She appears well-developed and well-nourished. No distress.  HENT:  Head: Normocephalic and atraumatic.  Right Ear: External ear normal.  Left Ear: External ear normal.  Nose: Nose normal.  Mouth/Throat: Oropharynx is clear and moist.  Eyes: Pupils are equal, round, and reactive to light. Conjunctivae and EOM are normal.  Cardiovascular: Normal rate, regular rhythm, normal heart sounds and intact distal pulses.  No murmur heard. Pulmonary/Chest: Effort normal and breath sounds normal. No stridor. No respiratory distress. She has no wheezes. She has no rales. She exhibits no tenderness.  Neurological: She is alert and oriented to person, place, and time.  Skin: Skin is warm and dry. Capillary refill takes less than 2 seconds. No rash noted. She is not diaphoretic. No erythema. No pallor.  Psychiatric: She has a normal mood and affect. Her behavior is normal. Judgment and thought content normal.  Nursing note and vitals reviewed.     Assessment & Plan:   1. Healthcare maintenance   2. Encounter for tobacco use cessation counseling     Encounter for tobacco use cessation counseling She denies thoughts of harming herself/others Started on wellbutrin 150mg  one tab daily for 3 dayr then titrate up to BID  Healthcare maintenance Increase water, follow Mediterranean diet. Start taking daily Multi-vitamin daily. Please start Wellbutrin 150mg - one tablet daily for three days, then increase to one tablet  every 12 hrs- start reducing tobacco use in 2 weeks. Please schedule complete physical in three weeks, fasting lab appt the week prior.    FOLLOW-UP:  Return in about 3 weeks (around 01/11/2018) for CPE, Fasting Labs.

## 2017-12-21 ENCOUNTER — Ambulatory Visit (INDEPENDENT_AMBULATORY_CARE_PROVIDER_SITE_OTHER): Payer: BLUE CROSS/BLUE SHIELD | Admitting: Adult Health

## 2017-12-21 ENCOUNTER — Encounter: Payer: Self-pay | Admitting: Adult Health

## 2017-12-21 VITALS — BP 107/74 | HR 85 | Ht 65.5 in | Wt 150.2 lb

## 2017-12-21 DIAGNOSIS — Z716 Tobacco abuse counseling: Secondary | ICD-10-CM | POA: Insufficient documentation

## 2017-12-21 DIAGNOSIS — Z0000 Encounter for general adult medical examination without abnormal findings: Secondary | ICD-10-CM | POA: Diagnosis not present

## 2017-12-21 MED ORDER — BUPROPION HCL ER (SR) 150 MG PO TB12: ORAL_TABLET | ORAL | 3 refills | Status: DC

## 2017-12-21 NOTE — Patient Instructions (Addendum)
Mediterranean Diet A Mediterranean diet refers to food and lifestyle choices that are based on the traditions of countries located on the [place]. This way of eating has been shown to help prevent certain conditions and improve outcomes for people who have chronic diseases, like kidney disease and heart disease. What are tips for following this plan? Lifestyle  Cook and eat meals together with your family, when possible.  Drink enough fluid to keep your urine clear or pale yellow.  Be physically active every day. This includes: ? Aerobic exercise like running or swimming. ? Leisure activities like gardening, walking, or housework.  Get 7-8 hours of sleep each night.  If recommended by your health care provider, drink red wine in moderation. This means 1 glass a day for nonpregnant women and 2 glasses a day for men. A glass of wine equals 5 oz (150 mL). Reading food labels  Check the serving size of packaged foods. For foods such as rice and pasta, the serving size refers to the amount of cooked product, not dry.  Check the total fat in packaged foods. Avoid foods that have saturated fat or trans fats.  Check the ingredients list for added sugars, such as corn syrup. Shopping  At the grocery store, buy most of your food from the areas near the walls of the store. This includes: ? Fresh fruits and vegetables (produce). ? Grains, beans, nuts, and seeds. Some of these may be available in unpackaged forms or large amounts (in bulk). ? Fresh seafood. ? Poultry and eggs. ? Low-fat dairy products.  Buy whole ingredients instead of prepackaged foods.  Buy fresh fruits and vegetables in-season from local farmers markets.  Buy frozen fruits and vegetables in resealable bags.  If you do not have access to quality fresh seafood, buy precooked frozen shrimp or canned fish, such as tuna, salmon, or sardines.  Buy small amounts of raw or cooked vegetables, salads, or olives from the  deli or salad bar at your store.  Stock your pantry so you always have certain foods on hand, such as olive oil, canned tuna, canned tomatoes, rice, pasta, and beans. Cooking  Cook foods with extra-virgin olive oil instead of using butter or other vegetable oils.  Have meat as a side dish, and have vegetables or grains as your main dish. This means having meat in small portions or adding small amounts of meat to foods like pasta or stew.  Use beans or vegetables instead of meat in common dishes like chili or lasagna.  Experiment with different cooking methods. Try roasting or broiling vegetables instead of steaming or sauteing them.  Add frozen vegetables to soups, stews, pasta, or rice.  Add nuts or seeds for added healthy fat at each meal. You can add these to yogurt, salads, or vegetable dishes.  Marinate fish or vegetables using olive oil, lemon juice, garlic, and fresh herbs. Meal planning  Plan to eat 1 vegetarian meal one day each week. Try to work up to 2 vegetarian meals, if possible.  Eat seafood 2 or more times a week.  Have healthy snacks readily available, such as: ? Vegetable sticks with hummus. ? Greek yogurt. ? Fruit and nut trail mix.  Eat balanced meals throughout the week. This includes: ? Fruit: 2-3 servings a day ? Vegetables: 4-5 servings a day ? Low-fat dairy: 2 servings a day ? Fish, poultry, or lean meat: 1 serving a day ? Beans and legumes: 2 or more servings a week ? Nuts   and seeds: 1-2 servings a day ? Whole grains: 6-8 servings a day ? Extra-virgin olive oil: 3-4 servings a day  Limit red meat and sweets to only a few servings a month What are my food choices?  Mediterranean diet ? Recommended ? Grains: Whole-grain pasta. Brown rice. Bulgar wheat. Polenta. Couscous. Whole-wheat bread. [place]. ? Vegetables: Artichokes. Beets. Broccoli. Cabbage. Carrots. Eggplant. Green beans. Chard. Kale. Spinach. Onions. Leeks. Peas. Squash.  Tomatoes. Peppers. Radishes. ? Fruits: Apples. Apricots. Avocado. Berries. Bananas. Cherries. Dates. Figs. Grapes. Lemons. Melon. Oranges. Peaches. Plums. Pomegranate. ? Meats and other protein foods: Beans. Almonds. Sunflower seeds. Pine nuts. Peanuts. [place]. Salmon. Scallops. Shrimp. [place]. Tilapia. Clams. Oysters. Eggs. ? Dairy: Low-fat milk. Cheese. Greek yogurt. ? Beverages: Water. Red wine. Herbal tea. ? Fats and oils: Extra virgin olive oil. Avocado oil. Grape seed oil. ? Sweets and desserts: [place] yogurt with honey. Baked apples. Poached pears. Trail mix. ? Seasoning and other foods: Basil. Cilantro. Coriander. Cumin. Mint. Parsley. Sage. Rosemary. Tarragon. Garlic. Oregano. Thyme. Pepper. Balsalmic vinegar. Tahini. Hummus. Tomato sauce. Olives. Mushrooms. ? Limit these ? Grains: Prepackaged pasta or rice dishes. Prepackaged cereal with added sugar. ? Vegetables: Deep fried potatoes (french fries). ? Fruits: Fruit canned in syrup. ? Meats and other protein foods: Beef. Pork. Lamb. Poultry with skin. Hot dogs. [place] Salines. ? Dairy: Ice cream. Sour cream. Whole milk. ? Beverages: Juice. Sugar-sweetened soft drinks. Beer. Liquor and spirits. ? Fats and oils: Butter. Canola oil. Vegetable oil. Beef fat (tallow). Lard. ? Sweets and desserts: Cookies. Cakes. Pies. Candy. ? Seasoning and other foods: Mayonnaise. Premade sauces and marinades. ? The items listed may not be a complete list. Talk with your dietitian about what dietary choices are right for you. Summary  The Mediterranean diet includes both food and lifestyle choices.  Eat a variety of fresh fruits and vegetables, beans, nuts, seeds, and whole grains.  Limit the amount of red meat and sweets that you eat.  Talk with your health care provider about whether it is safe for you to drink red wine in moderation. This means 1 glass a day for nonpregnant women and 2 glasses a day for men. A glass of wine equals 5 oz (150 mL). This information  is not intended to replace advice given to you by your health care provider. Make sure you discuss any questions you have with your health care provider. Document Released: 10/24/2015 Document Revised: 11/26/2015 Document Reviewed: 10/24/2015 Elsevier Interactive Patient Education  2018 [employer].  Dizziness Dizziness is a common problem. It is a feeling of unsteadiness or light-headedness. You may feel like you are about to faint. Dizziness can lead to injury if you stumble or fall. Anyone can become dizzy, but dizziness is more common in older adults. This condition can be caused by a number of things, including medicines, dehydration, or illness. Follow these instructions at home: Eating and drinking  Drink enough fluid to keep your urine clear or pale yellow. This helps to keep you from becoming dehydrated. Try to drink more clear fluids, such as water.  Do not drink alcohol.  Limit your caffeine intake if told to do so by your health care provider. Check ingredients and nutrition facts to see if a food or beverage contains caffeine.  Limit your salt (sodium) intake if told to do so by your health care provider. Check ingredients and nutrition facts to see if a food or beverage contains sodium. Activity  Avoid making quick movements. ? Rise slowly  from chairs and steady yourself until you feel okay. ? In the morning, first sit up on the side of the bed. When you feel okay, stand slowly while you hold onto something until you know that your balance is fine.  If you need to stand in one place for a long time, move your legs often. Tighten and relax the muscles in your legs while you are standing.  Do not drive or use heavy machinery if you feel dizzy.  Avoid bending down if you feel dizzy. Place items in your home so that they are easy for you to reach without leaning over. Lifestyle  Do not use any products that contain nicotine or tobacco, such as cigarettes and e-cigarettes. If  you need help quitting, ask your health care provider.  Try to reduce your stress level by using methods such as yoga or meditation. Talk with your health care provider if you need help to manage your stress. General instructions  Watch your dizziness for any changes.  Take over-the-counter and prescription medicines only as told by your health care provider. Talk with your health care provider if you think that your dizziness is caused by a medicine that you are taking.  Tell a friend or a family member that you are feeling dizzy. If he or she notices any changes in your behavior, have this person call your health care provider.  Keep all follow-up visits as told by your health care provider. This is important. Contact a health care provider if:  Your dizziness does not go away.  Your dizziness or light-headedness gets worse.  You feel nauseous.  You have reduced hearing.  You have new symptoms.  You are unsteady on your feet or you feel like the room is spinning. Get help right away if:  You vomit or have diarrhea and are unable to eat or drink anything.  You have problems talking, walking, swallowing, or using your arms, hands, or legs.  You feel generally weak.  You are not thinking clearly or you have trouble forming sentences. It may take a friend or family member to notice this.  You have chest pain, abdominal pain, shortness of breath, or sweating.  Your vision changes.  You have any bleeding.  You have a severe headache.  You have neck pain or a stiff neck.  You have a fever. These symptoms may represent a serious problem that is an emergency. Do not wait to see if the symptoms will go away. Get medical help right away. Call your local emergency services (911 in the [place] Do not drive yourself to the hospital. Summary  Dizziness is a feeling of unsteadiness or light-headedness. This condition can be caused by a number of things, including medicines,  dehydration, or illness.  Anyone can become dizzy, but dizziness is more common in older adults.  Drink enough fluid to keep your urine clear or pale yellow. Do not drink alcohol.  Avoid making quick movements if you feel dizzy. Monitor your dizziness for any changes. This information is not intended to replace advice given to you by your health care provider. Make sure you discuss any questions you have with your health care provider. Document Released: 08/26/2000 Document Revised: 04/04/2016 Document Reviewed: 04/04/2016 Elsevier Interactive Patient Education  2018 [employer].  Increase water, follow Mediterranean diet. Start taking daily Multi-vitamin daily. Please start Wellbutrin 150mg - one tablet daily for three days, then increase to one tablet every 12 hrs- start reducing tobacco use in 2 weeks.  Please schedule complete physical in three weeks, fasting lab appt the week prior. WELCOME TO THE PRACTICE!

## 2017-12-21 NOTE — Assessment & Plan Note (Signed)
Increase water, follow Mediterranean diet. Start taking daily Multi-vitamin daily. Please start Wellbutrin 150mg - one tablet daily for three days, then increase to one tablet every 12 hrs- start reducing tobacco use in 2 weeks. Please schedule complete physical in three weeks, fasting lab appt the week prior.

## 2017-12-21 NOTE — Assessment & Plan Note (Signed)
She denies thoughts of harming herself/others Started on wellbutrin 150mg  one tab daily for 3 dayr then titrate up to BID

## 2017-12-22 ENCOUNTER — Encounter: Payer: Self-pay | Admitting: Adult Health

## 2017-12-23 ENCOUNTER — Encounter: Payer: Self-pay | Admitting: Adult Health

## 2017-12-28 ENCOUNTER — Other Ambulatory Visit: Payer: Self-pay | Admitting: Adult Health

## 2017-12-28 ENCOUNTER — Encounter: Payer: Self-pay | Admitting: Adult Health

## 2017-12-31 ENCOUNTER — Encounter: Payer: Self-pay | Admitting: Adult Health

## 2018-01-03 ENCOUNTER — Other Ambulatory Visit: Payer: Self-pay | Admitting: Adult Health

## 2018-01-03 MED ORDER — AMOXICILLIN-POT CLAVULANATE 875-125 MG PO TABS: 1.0000 | ORAL_TABLET | Freq: Two times a day (BID) | ORAL | 0 refills | Status: DC

## 2018-01-03 NOTE — Progress Notes (Signed)
augmentin sent in for sinus infection

## 2018-01-10 ENCOUNTER — Other Ambulatory Visit: Payer: BLUE CROSS/BLUE SHIELD

## 2018-01-10 DIAGNOSIS — Z0000 Encounter for general adult medical examination without abnormal findings: Secondary | ICD-10-CM | POA: Diagnosis not present

## 2018-01-10 DIAGNOSIS — R002 Palpitations: Secondary | ICD-10-CM | POA: Diagnosis not present

## 2018-01-11 LAB — LIPID PANEL
Chol/HDL Ratio: 4.1 ratio (ref 0.0–4.4)
Cholesterol, Total: 184 mg/dL (ref 100–199)
HDL: 45 mg/dL (ref >39)
LDL Calculated: 116 mg/dL — ABNORMAL HIGH (ref 0–99)
Triglycerides: 113 mg/dL (ref 0–149)
VLDL Cholesterol Cal: 23 mg/dL (ref 5–40)

## 2018-01-11 LAB — HEMOGLOBIN A1C
Est. average glucose Bld gHb Est-mCnc: 105 mg/dL
Hgb A1c MFr Bld: 5.3 % (ref 4.8–5.6)

## 2018-01-11 LAB — COMPREHENSIVE METABOLIC PANEL
ALT: 15 IU/L (ref 0–32)
AST: 18 IU/L (ref 0–40)
Albumin/Globulin Ratio: 1.7 (ref 1.2–2.2)
Albumin: 4.4 g/dL (ref 3.5–5.5)
Alkaline Phosphatase: 85 IU/L (ref 39–117)
BUN/Creatinine Ratio: 10 (ref 9–23)
BUN: 9 mg/dL (ref 6–24)
Bilirubin Total: 0.4 mg/dL (ref 0.0–1.2)
CO2: 24 mmol/L (ref 20–29)
Calcium: 9.8 mg/dL (ref 8.7–10.2)
Chloride: 105 mmol/L (ref 96–106)
Creatinine, Ser: 0.89 mg/dL (ref 0.57–1.00)
GFR calc Af Amer: 92 mL/min/{1.73_m2} (ref >59)
GFR calc non Af Amer: 80 mL/min/{1.73_m2} (ref >59)
Globulin, Total: 2.6 g/dL (ref 1.5–4.5)
Glucose: 86 mg/dL (ref 65–99)
Potassium: 5.4 mmol/L — ABNORMAL HIGH (ref 3.5–5.2)
Sodium: 141 mmol/L (ref 134–144)
Total Protein: 7 g/dL (ref 6.0–8.5)

## 2018-01-11 LAB — CBC WITH DIFFERENTIAL/PLATELET
Basophils Absolute: 0 10*3/uL (ref 0.0–0.2)
Basos: 0 %
EOS (ABSOLUTE): 0.5 10*3/uL — ABNORMAL HIGH (ref 0.0–0.4)
Eos: 4 %
Hematocrit: 42.5 % (ref 34.0–46.6)
Hemoglobin: 14.4 g/dL (ref 11.1–15.9)
Immature Grans (Abs): 0 10*3/uL (ref 0.0–0.1)
Immature Granulocytes: 0 %
Lymphocytes Absolute: 2.3 10*3/uL (ref 0.7–3.1)
Lymphs: 18 %
MCH: 29.6 pg (ref 26.6–33.0)
MCHC: 33.9 g/dL (ref 31.5–35.7)
MCV: 87 fL (ref 79–97)
Monocytes Absolute: 0.7 10*3/uL (ref 0.1–0.9)
Monocytes: 5 %
Neutrophils Absolute: 9.5 10*3/uL — ABNORMAL HIGH (ref 1.4–7.0)
Neutrophils: 73 %
Platelets: 414 10*3/uL (ref 150–450)
RBC: 4.86 x10E6/uL (ref 3.77–5.28)
RDW: 12.1 % — ABNORMAL LOW (ref 12.3–15.4)
WBC: 13.2 10*3/uL — ABNORMAL HIGH (ref 3.4–10.8)

## 2018-01-11 LAB — TSH: TSH: 1.41 u[IU]/mL (ref 0.450–4.500)

## 2018-01-17 NOTE — Progress Notes (Addendum)
Subjective:    Patient ID: Janice Silva, female    DOB: February 22, 1976, 42 y.o.   MRN: [id number]  HPI: 12/21/17 OV:  Janice Silva is here to establish as a new pt. She is a pleasant 42 year old female. PMH: Intermittent dizziness the last 12 months that has increased in frequency and intensity in last 5 weeks.  She was seen in UC believing that sx's were r/t to "clogged ears" and instructed to take OTC Sudafed, which only slightly reduced sx's. She was seen and adjusted by chiropractor 3 times the last two weeks and reports slight reduction in dizziness, estimates will occur every "few days now". She denies dizziness occurring at any specific time of day.  She denies hx of head injury or vertigo. She denies personal/family hx of neurological disorders. She estimates to drink 40-60 oz water/day and tries to "eat as healthy as I can". She walks daily and has started [employer]" on YouTube She uses tobacco- 1/4 pack/day,  for >25 years- she is agreeable to smoking cessation. She is married with two daughters-ages 22,17  01/18/18 OV: Janice Silva is here for CPE She continues to use tobacco, estimates to smoke 5-7 cigarettes/day She reports completing course of Augmentin 2 weeks ago to treat sinus infection She reports frontal HA the last two days, described as pressure and rated 4/10 She has been using OTC Excedrin Migraine, last dose was this morning. She denies fever/night sweats/chills/N/V/D/dizziness She reports regular HAs, never been dx'd with migraine's She has never been seen by Neurologist Reviewed most recent labs- LDL- Bruno Maintenance: PAP-UTD Mammogram-Will complete next month Immunizations-UTD  Patient Care Team    Relationship Specialty Notifications Start End  Mina Marble D, NP PCP - General Family Medicine  12/21/17   Vanessa Kick, MD Consulting Physician Obstetrics and Gynecology  12/23/17     Patient Active Problem List   Diagnosis Date Noted  .  Healthcare maintenance 12/21/2017  . Encounter for tobacco use cessation counseling 12/21/2017  . Abnormal EKG 11/19/2014  . Thrombocytosis ([place]) 09/16/2014  . Palpitations 09/16/2014     Past Medical History:  Diagnosis Date  . Dizziness   . Elevated WBC count   . Hyperventilation   . Nausea   . Numbness of feet   . Numbness of hand   . Palpitation   . Racing heart beat   . Thrombocytosis ([place])      Past Surgical History:  Procedure Laterality Date  . MYRINGOTOMY WITH TUBE PLACEMENT    . TONSILLECTOMY AND ADENOIDECTOMY       Family History  Problem Relation Age of Onset  . Lupus Mother   . Healthy Father   . Healthy Sister   . Healthy Daughter   . Healthy Daughter      Social History   Substance and Sexual Activity  Drug Use No     Social History   Substance and Sexual Activity  Alcohol Use Yes  . Alcohol/week: 0.0 standard drinks   Comment: social     Social History   Tobacco Use  Smoking Status Current Some Day Smoker  . Packs/day: 0.25  . Years: 25.00  . Pack years: 6.25  . Types: Cigarettes  Smokeless Tobacco Never Used     Outpatient Encounter Medications as of 01/18/2018  Medication Sig  . ferrous sulfate 325 (65 FE) MG tablet Take 325 mg by mouth daily with breakfast.  . norethindrone-ethinyl estradiol (OVCON-35, 28,) 0.4-35 MG-MCG tablet Ovcon-35 (28)  .  vitamin B-12 (CYANOCOBALAMIN) 500 MCG tablet Take 500 mcg by mouth daily.  . [DISCONTINUED] amoxicillin-clavulanate (AUGMENTIN) 875-125 MG tablet Take 1 tablet by mouth 2 (two) times daily.   No facility-administered encounter medications on file as of 01/18/2018.     Allergies: Patient has no known allergies.  Body mass index is 25.19 kg/m.  Blood pressure 100/70, pulse 97, temperature 98.6 F (37 C), temperature source Oral, height 5' 5.5" (1.664 m), weight 153 lb 11.2 oz (69.7 kg), SpO2 98 %.  Review of Systems  Constitutional: Positive for fatigue. Negative for activity  change, appetite change, chills, diaphoresis, fever and unexpected weight change.  HENT: Negative for congestion.   Eyes: Negative for visual disturbance.  Respiratory: Negative for cough, chest tightness, shortness of breath, wheezing and stridor.   Cardiovascular: Negative for chest pain, palpitations and leg swelling.  Genitourinary: Negative for difficulty urinating and flank pain.  Musculoskeletal: Negative for arthralgias, back pain, gait problem, joint swelling, myalgias, neck pain and neck stiffness.  Skin: Negative for color change, pallor, rash and wound.  Neurological: Positive for dizziness and headaches.  Hematological: Does not bruise/bleed easily.  Psychiatric/Behavioral: Negative for behavioral problems, confusion, decreased concentration, dysphoric mood, hallucinations, self-injury, sleep disturbance and suicidal ideas. The patient is not nervous/anxious and is not hyperactive.        Objective:   Physical Exam  Constitutional: She is oriented to person, place, and time. She appears well-developed and well-nourished. No distress.  HENT:  Head: Normocephalic and atraumatic.  Right Ear: External ear normal. Tympanic membrane is not erythematous and not bulging. No decreased hearing is noted.  Left Ear: External ear normal. Tympanic membrane is not erythematous and not bulging. No decreased hearing is noted.  Nose: Nose normal. No mucosal edema or rhinorrhea. Right sinus exhibits no maxillary sinus tenderness and no frontal sinus tenderness. Left sinus exhibits no maxillary sinus tenderness and no frontal sinus tenderness.  Mouth/Throat: Oropharynx is clear and moist and mucous membranes are normal. Abnormal dentition. No posterior oropharyngeal edema or posterior oropharyngeal erythema. Tonsils are 0 on the right. Tonsils are 0 on the left. No tonsillar exudate.  Eyes: Pupils are equal, round, and reactive to light. Conjunctivae and EOM are normal.  Neck: Normal range of  motion. Neck supple.  Cardiovascular: Normal rate, regular rhythm, normal heart sounds and intact distal pulses.  No murmur heard. Pulmonary/Chest: Effort normal and breath sounds normal. No stridor. No respiratory distress. She has no wheezes. She has no rales. She exhibits no tenderness.  Abdominal: Soft. Bowel sounds are normal. She exhibits no distension and no mass. There is no tenderness. There is no rebound and no guarding. No hernia.  Musculoskeletal: Normal range of motion. She exhibits no edema or tenderness.  Lymphadenopathy:    She has no cervical adenopathy.  Neurological: She is alert and oriented to person, place, and time. Coordination normal.  Skin: Skin is warm and dry. Capillary refill takes less than 2 seconds. No rash noted. She is not diaphoretic. No erythema. No pallor.  Psychiatric: She has a normal mood and affect. Her behavior is normal. Judgment and thought content normal.  Nursing note and vitals reviewed.     Assessment & Plan:   1. Healthcare maintenance     Healthcare maintenance Overall labs look good, reduce saturated fat and increase frequency and intensity of walking to reduce LDL cholesterol. Increase water, stop tobacco use, and increase vit c to resolve sinus headache. If not improved, then please make office appt so  we can further discuss. Please keep your OB/GYN appt next month.   FOLLOW-UP:  Return in about 1 year (around 01/19/2019).

## 2018-01-18 ENCOUNTER — Encounter: Payer: Self-pay | Admitting: Adult Health

## 2018-01-18 ENCOUNTER — Ambulatory Visit (INDEPENDENT_AMBULATORY_CARE_PROVIDER_SITE_OTHER): Payer: BLUE CROSS/BLUE SHIELD | Admitting: Adult Health

## 2018-01-18 VITALS — BP 100/70 | HR 97 | Temp 98.6°F | Ht 65.5 in | Wt 153.7 lb

## 2018-01-18 DIAGNOSIS — Z0000 Encounter for general adult medical examination without abnormal findings: Secondary | ICD-10-CM

## 2018-01-18 NOTE — Patient Instructions (Addendum)
Preventive Care for Adults, Female  A healthy lifestyle and preventive care can promote health and wellness. Preventive health guidelines for women include the following key practices.   A routine yearly physical is a good way to check with your health care provider about your health and preventive screening. It is a chance to share any concerns and updates on your health and to receive a thorough exam.   Visit your dentist for a routine exam and preventive care every 6 months. Brush your teeth twice a day and floss once a day. Good oral hygiene prevents tooth decay and gum disease.   The frequency of eye exams is based on your age, health, family medical history, use of contact lenses, and other factors. Follow your health care provider's recommendations for frequency of eye exams.   Eat a healthy diet. Foods like vegetables, fruits, whole grains, low-fat dairy products, and lean protein foods contain the nutrients you need without too many calories. Decrease your intake of foods high in solid fats, added sugars, and salt. Eat the right amount of calories for you.Get information about a proper diet from your health care provider, if necessary.   Regular physical exercise is one of the most important things you can do for your health. Most adults should get at least 150 minutes of moderate-intensity exercise (any activity that increases your heart rate and causes you to sweat) each week. In addition, most adults need muscle-strengthening exercises on 2 or more days a week.   Maintain a healthy weight. The body mass index (BMI) is a screening tool to identify possible weight problems. It provides an estimate of body fat based on height and weight. Your health care provider can find your BMI, and can help you achieve or maintain a healthy weight.For adults 20 years and older:   - A BMI below 18.5 is considered underweight.   - A BMI of 18.5 to 24.9 is normal.   - A BMI of 25 to 29.9 is  considered overweight.   - A BMI of 30 and above is considered obese.   Maintain normal blood lipids and cholesterol levels by exercising and minimizing your intake of trans and saturated fats.  Eat a balanced diet with plenty of fruit and vegetables. Blood tests for lipids and cholesterol should begin at age 44 and be repeated every 5 years minimum.  If your lipid or cholesterol levels are high, you are over 40, or you are at high risk for heart disease, you may need your cholesterol levels checked more frequently.Ongoing high lipid and cholesterol levels should be treated with medicines if diet and exercise are not working.   If you smoke, find out from your health care provider how to quit. If you do not use tobacco, do not start.   Lung cancer screening is recommended for adults aged 88-80 years who are at high risk for developing lung cancer because of a history of smoking. A yearly low-dose CT scan of the lungs is recommended for people who have at least a 30-pack-year history of smoking and are a current smoker or have quit within the past 15 years. A pack year of smoking is smoking an average of 1 pack of cigarettes a day for 1 year (for example: 1 pack a day for 30 years or 2 packs a day for 15 years). Yearly screening should continue until the smoker has stopped smoking for at least 15 years. Yearly screening should be stopped for people who develop a  health problem that would prevent them from having lung cancer treatment.   If you are pregnant, do not drink alcohol. If you are breastfeeding, be very cautious about drinking alcohol. If you are not pregnant and choose to drink alcohol, do not have more than 1 drink per day. One drink is considered to be 12 ounces (355 mL) of beer, 5 ounces (148 mL) of wine, or 1.5 ounces (44 mL) of liquor.   Avoid use of street drugs. Do not share needles with anyone. Ask for help if you need support or instructions about stopping the use of  drugs.   High blood pressure causes heart disease and increases the risk of stroke. Your blood pressure should be checked at least yearly.  Ongoing high blood pressure should be treated with medicines if weight loss and exercise do not work.   If you are 69-55 years old, ask your health care provider if you should take aspirin to prevent strokes.   Diabetes screening involves taking a blood sample to check your fasting blood sugar level. This should be done once every 3 years, after age 38, if you are within normal weight and without risk factors for diabetes. Testing should be considered at a younger age or be carried out more frequently if you are overweight and have at least 1 risk factor for diabetes.   Breast cancer screening is essential preventive care for women. You should practice "breast self-awareness."  This means understanding the normal appearance and feel of your breasts and may include breast self-examination.  Any changes detected, no matter how small, should be reported to a health care provider.  Women in their 80s and 30s should have a clinical breast exam (CBE) by a health care provider as part of a regular health exam every 1 to 3 years.  After age 66, women should have a CBE every year.  Starting at age 1, women should consider having a mammogram (breast X-ray test) every year.  Women who have a family history of breast cancer should talk to their health care provider about genetic screening.  Women at a high risk of breast cancer should talk to their health care providers about having an MRI and a mammogram every year.   -Breast cancer gene (BRCA)-related cancer risk assessment is recommended for women who have family members with BRCA-related cancers. BRCA-related cancers include breast, ovarian, tubal, and peritoneal cancers. Having family members with these cancers may be associated with an increased risk for harmful changes (mutations) in the breast cancer genes BRCA1 and  BRCA2. Results of the assessment will determine the need for genetic counseling and BRCA1 and BRCA2 testing.   The Pap test is a screening test for cervical cancer. A Pap test can show cell changes on the cervix that might become cervical cancer if left untreated. A Pap test is a procedure in which cells are obtained and examined from the lower end of the uterus (cervix).   - Women should have a Pap test starting at age 57.   - Between ages 90 and 70, Pap tests should be repeated every 2 years.   - Beginning at age 63, you should have a Pap test every 3 years as long as the past 3 Pap tests have been normal.   - Some women have medical problems that increase the chance of getting cervical cancer. Talk to your health care provider about these problems. It is especially important to talk to your health care provider if a  new problem develops soon after your last Pap test. In these cases, your health care provider may recommend more frequent screening and Pap tests.   - The above recommendations are the same for women who have or have not gotten the vaccine for human papillomavirus (HPV).   - If you had a hysterectomy for a problem that was not cancer or a condition that could lead to cancer, then you no longer need Pap tests. Even if you no longer need a Pap test, a regular exam is a good idea to make sure no other problems are starting.   - If you are between ages 36 and 66 years, and you have had normal Pap tests going back 10 years, you no longer need Pap tests. Even if you no longer need a Pap test, a regular exam is a good idea to make sure no other problems are starting.   - If you have had past treatment for cervical cancer or a condition that could lead to cancer, you need Pap tests and screening for cancer for at least 20 years after your treatment.   - If Pap tests have been discontinued, risk factors (such as a new sexual partner) need to be reassessed to determine if screening should  be resumed.   - The HPV test is an additional test that may be used for cervical cancer screening. The HPV test looks for the virus that can cause the cell changes on the cervix. The cells collected during the Pap test can be tested for HPV. The HPV test could be used to screen women aged 70 years and older, and should be used in women of any age who have unclear Pap test results. After the age of 67, women should have HPV testing at the same frequency as a Pap test.   Colorectal cancer can be detected and often prevented. Most routine colorectal cancer screening begins at the age of 57 years and continues through age 26 years. However, your health care provider may recommend screening at an earlier age if you have risk factors for colon cancer. On a yearly basis, your health care provider may provide home test kits to check for hidden blood in the stool.  Use of a small camera at the end of a tube, to directly examine the colon (sigmoidoscopy or colonoscopy), can detect the earliest forms of colorectal cancer. Talk to your health care provider about this at age 23, when routine screening begins. Direct exam of the colon should be repeated every 5 -10 years through age 49 years, unless early forms of pre-cancerous polyps or small growths are found.   People who are at an increased risk for hepatitis B should be screened for this virus. You are considered at high risk for hepatitis B if:  -You were born in a country where hepatitis B occurs often. Talk with your health care provider about which countries are considered high risk.  - Your parents were born in a high-risk country and you have not received a shot to protect against hepatitis B (hepatitis B vaccine).  - You have HIV or AIDS.  - You use needles to inject street drugs.  - You live with, or have sex with, someone who has Hepatitis B.  - You get hemodialysis treatment.  - You take certain medicines for conditions like cancer, organ  transplantation, and autoimmune conditions.   Hepatitis C blood testing is recommended for all people born from [birth year] through 1965 and any individual  with known risks for hepatitis C.   Practice safe sex. Use condoms and avoid high-risk sexual practices to reduce the spread of sexually transmitted infections (STIs). STIs include gonorrhea, chlamydia, syphilis, trichomonas, herpes, HPV, and human immunodeficiency virus (HIV). Herpes, HIV, and HPV are viral illnesses that have no cure. They can result in disability, cancer, and death. Sexually active women aged 25 years and younger should be checked for chlamydia. Older women with new or multiple partners should also be tested for chlamydia. Testing for other STIs is recommended if you are sexually active and at increased risk.   Osteoporosis is a disease in which the bones lose minerals and strength with aging. This can result in serious bone fractures or breaks. The risk of osteoporosis can be identified using a bone density scan. Women ages 65 years and over and women at risk for fractures or osteoporosis should discuss screening with their health care providers. Ask your health care provider whether you should take a calcium supplement or vitamin D to There are also several preventive steps women can take to avoid osteoporosis and resulting fractures or to keep osteoporosis from worsening. -->Recommendations include:  Eat a balanced diet high in fruits, vegetables, calcium, and vitamins.  Get enough calcium. The recommended total intake of is 1,200 mg daily; for best absorption, if taking supplements, divide doses into 250-500 mg doses throughout the day. Of the two types of calcium, calcium carbonate is best absorbed when taken with food but calcium citrate can be taken on an empty stomach.  Get enough vitamin D. NAMS and the National Osteoporosis Foundation recommend at least 1,000 IU per day for women age 50 and over who are at risk of vitamin D  deficiency. Vitamin D deficiency can be caused by inadequate sun exposure (for example, those who live in northern latitudes).  Avoid alcohol and smoking. Heavy alcohol intake (more than 7 drinks per week) increases the risk of falls and hip fracture and women smokers tend to lose bone more rapidly and have lower bone mass than nonsmokers. Stopping smoking is one of the most important changes women can make to improve their health and decrease risk for disease.  Be physically active every day. Weight-bearing exercise (for example, fast walking, hiking, jogging, and weight training) may strengthen bones or slow the rate of bone loss that comes with aging. Balancing and muscle-strengthening exercises can reduce the risk of falling and fracture.  Consider therapeutic medications. Currently, several types of effective drugs are available. Healthcare providers can recommend the type most appropriate for each woman.  Eliminate environmental factors that may contribute to accidents. Falls cause nearly 90% of all osteoporotic fractures, so reducing this risk is an important bone-health strategy. Measures include ample lighting, removing obstructions to walking, using nonskid rugs on floors, and placing mats and/or grab bars in showers.  Be aware of medication side effects. Some common medicines make bones weaker. These include a type of steroid drug called glucocorticoids used for arthritis and asthma, some antiseizure drugs, certain sleeping pills, treatments for endometriosis, and some cancer drugs. An overactive thyroid gland or using too much thyroid hormone for an underactive thyroid can also be a problem. If you are taking these medicines, talk to your doctor about what you can do to help protect your bones.reduce the rate of osteoporosis.    Menopause can be associated with physical symptoms and risks. Hormone replacement therapy is available to decrease symptoms and risks. You should talk to your  health care provider   about whether hormone replacement therapy is right for you.   Use sunscreen. Apply sunscreen liberally and repeatedly throughout the day. You should seek shade when your shadow is shorter than you. Protect yourself by wearing long sleeves, pants, a wide-brimmed hat, and sunglasses year round, whenever you are outdoors.   Once a month, do a whole body skin exam, using a mirror to look at the skin on your back. Tell your health care provider of new moles, moles that have irregular borders, moles that are larger than a pencil eraser, or moles that have changed in shape or color.   -Stay current with required vaccines (immunizations).   Influenza vaccine. All adults should be immunized every year.  Tetanus, diphtheria, and acellular pertussis (Td, Tdap) vaccine. Pregnant women should receive 1 dose of Tdap vaccine during each pregnancy. The dose should be obtained regardless of the length of time since the last dose. Immunization is preferred during the 27th 36th week of gestation. An adult who has not previously received Tdap or who does not know her vaccine status should receive 1 dose of Tdap. This initial dose should be followed by tetanus and diphtheria toxoids (Td) booster doses every 10 years. Adults with an unknown or incomplete history of completing a 3-dose immunization series with Td-containing vaccines should begin or complete a primary immunization series including a Tdap dose. Adults should receive a Td booster every 10 years.  Varicella vaccine. An adult without evidence of immunity to varicella should receive 2 doses or a second dose if she has previously received 1 dose. Pregnant females who do not have evidence of immunity should receive the first dose after pregnancy. This first dose should be obtained before leaving the health care facility. The second dose should be obtained 4 8 weeks after the first dose.  Human papillomavirus (HPV) vaccine. Females aged 81 26  years who have not received the vaccine previously should obtain the 3-dose series. The vaccine is not recommended for use in pregnant females. However, pregnancy testing is not needed before receiving a dose. If a female is found to be pregnant after receiving a dose, no treatment is needed. In that case, the remaining doses should be delayed until after the pregnancy. Immunization is recommended for any person with an immunocompromised condition through the age of 32 years if she did not get any or all doses earlier. During the 3-dose series, the second dose should be obtained 4 8 weeks after the first dose. The third dose should be obtained 24 weeks after the first dose and 16 weeks after the second dose.  Zoster vaccine. One dose is recommended for adults aged 70 years or older unless certain conditions are present.  Measles, mumps, and rubella (MMR) vaccine. Adults born before [birth year] generally are considered immune to measles and mumps. Adults born in [birth year] or later should have 1 or more doses of MMR vaccine unless there is a contraindication to the vaccine or there is laboratory evidence of immunity to each of the three diseases. A routine second dose of MMR vaccine should be obtained at least 28 days after the first dose for students attending postsecondary schools, health care workers, or international travelers. People who received inactivated measles vaccine or an unknown type of measles vaccine during 1963 1967 should receive 2 doses of MMR vaccine. People who received inactivated mumps vaccine or an unknown type of mumps vaccine before 1979 and are at high risk for mumps infection should consider immunization with 2 doses of  MMR vaccine. For females of childbearing age, rubella immunity should be determined. If there is no evidence of immunity, females who are not pregnant should be vaccinated. If there is no evidence of immunity, females who are pregnant should delay immunization until after pregnancy.  Unvaccinated health care workers born before [birth year] who lack laboratory evidence of measles, mumps, or rubella immunity or laboratory confirmation of disease should consider measles and mumps immunization with 2 doses of MMR vaccine or rubella immunization with 1 dose of MMR vaccine.  Pneumococcal 13-valent conjugate (PCV13) vaccine. When indicated, a person who is uncertain of her immunization history and has no record of immunization should receive the PCV13 vaccine. An adult aged 54 years or older who has certain medical conditions and has not been previously immunized should receive 1 dose of PCV13 vaccine. This PCV13 should be followed with a dose of pneumococcal polysaccharide (PPSV23) vaccine. The PPSV23 vaccine dose should be obtained at least 8 weeks after the dose of PCV13 vaccine. An adult aged 58 years or older who has certain medical conditions and previously received 1 or more doses of PPSV23 vaccine should receive 1 dose of PCV13. The PCV13 vaccine dose should be obtained 1 or more years after the last PPSV23 vaccine dose.  Pneumococcal polysaccharide (PPSV23) vaccine. When PCV13 is also indicated, PCV13 should be obtained first. All adults aged 58 years and older should be immunized. An adult younger than age 65 years who has certain medical conditions should be immunized. Any person who resides in a nursing home or long-term care facility should be immunized. An adult smoker should be immunized. People with an immunocompromised condition and certain other conditions should receive both PCV13 and PPSV23 vaccines. People with human immunodeficiency virus (HIV) infection should be immunized as soon as possible after diagnosis. Immunization during chemotherapy or radiation therapy should be avoided. Routine use of PPSV23 vaccine is not recommended for American Indians, [place] Natives, or people younger than 65 years unless there are medical conditions that require PPSV23 vaccine. When indicated,  people who have unknown immunization and have no record of immunization should receive PPSV23 vaccine. One-time revaccination 5 years after the first dose of PPSV23 is recommended for people aged 70 64 years who have chronic kidney failure, nephrotic syndrome, asplenia, or immunocompromised conditions. People who received 1 2 doses of PPSV23 before age 32 years should receive another dose of PPSV23 vaccine at age 96 years or later if at least 5 years have passed since the previous dose. Doses of PPSV23 are not needed for people immunized with PPSV23 at or after age 55 years.  Meningococcal vaccine. Adults with asplenia or persistent complement component deficiencies should receive 2 doses of quadrivalent meningococcal conjugate (MenACWY-D) vaccine. The doses should be obtained at least 2 months apart. Microbiologists working with certain meningococcal bacteria, [employer] recruits, people at risk during an outbreak, and people who travel to or live in countries with a high rate of meningitis should be immunized. A first-year college student up through age 58 years who is living in a residence hall should receive a dose if she did not receive a dose on or after her 16th birthday. Adults who have certain high-risk conditions should receive one or more doses of vaccine.  Hepatitis A vaccine. Adults who wish to be protected from this disease, have certain high-risk conditions, work with hepatitis A-infected animals, work in hepatitis A research labs, or travel to or work in countries with a high rate of hepatitis A should be  immunized. Adults who were previously unvaccinated and who anticipate close contact with an international adoptee during the first 60 days after arrival in the [place] from a country with a high rate of hepatitis A should be immunized.  Hepatitis B vaccine.  Adults who wish to be protected from this disease, have certain high-risk conditions, may be exposed to blood or other infectious  body fluids, are household contacts or sex partners of hepatitis B positive people, are clients or workers in certain care facilities, or travel to or work in countries with a high rate of hepatitis B should be immunized.  Haemophilus influenzae type b (Hib) vaccine. A previously unvaccinated person with asplenia or sickle cell disease or having a scheduled splenectomy should receive 1 dose of Hib vaccine. Regardless of previous immunization, a recipient of a hematopoietic stem cell transplant should receive a 3-dose series 6 12 months after her successful transplant. Hib vaccine is not recommended for adults with HIV infection.  Preventive Services / Frequency Ages 92 to 39years  Blood pressure check.** / Every 1 to 2 years.  Lipid and cholesterol check.** / Every 5 years beginning at age 31.  Clinical breast exam.** / Every 3 years for women in their 65s and 46s.  BRCA-related cancer risk assessment.** / For women who have family members with a BRCA-related cancer (breast, ovarian, tubal, or peritoneal cancers).  Pap test.** / Every 2 years from ages 39 through 28. Every 3 years starting at age 26 through age 17 or 83 with a history of 3 consecutive normal Pap tests.  HPV screening.** / Every 3 years from ages 44 through ages 43 to 85 with a history of 3 consecutive normal Pap tests.  Hepatitis C blood test.** / For any individual with known risks for hepatitis C.  Skin self-exam. / Monthly.  Influenza vaccine. / Every year.  Tetanus, diphtheria, and acellular pertussis (Tdap, Td) vaccine.** / Consult your health care provider. Pregnant women should receive 1 dose of Tdap vaccine during each pregnancy. 1 dose of Td every 10 years.  Varicella vaccine.** / Consult your health care provider. Pregnant females who do not have evidence of immunity should receive the first dose after pregnancy.  HPV vaccine. / 3 doses over 6 months, if 64 and younger. The vaccine is not recommended for use in  pregnant females. However, pregnancy testing is not needed before receiving a dose.  Measles, mumps, rubella (MMR) vaccine.** / You need at least 1 dose of MMR if you were born in 1957 or later. You may also need a 2nd dose. For females of childbearing age, rubella immunity should be determined. If there is no evidence of immunity, females who are not pregnant should be vaccinated. If there is no evidence of immunity, females who are pregnant should delay immunization until after pregnancy.  Pneumococcal 13-valent conjugate (PCV13) vaccine.** / Consult your health care provider.  Pneumococcal polysaccharide (PPSV23) vaccine.** / 1 to 2 doses if you smoke cigarettes or if you have certain conditions.  Meningococcal vaccine.** / 1 dose if you are age 71 to 89 years and a Market researcher living in a residence hall, or have one of several medical conditions, you need to get vaccinated against meningococcal disease. You may also need additional booster doses.  Hepatitis A vaccine.** / Consult your health care provider.  Hepatitis B vaccine.** / Consult your health care provider.  Haemophilus influenzae type b (Hib) vaccine.** / Consult your health care provider.  Ages 30 to 1years  Blood pressure check.** / Every 1 to 2 years.  Lipid and cholesterol check.** / Every 5 years beginning at age 42 years.  Lung cancer screening. / Every year if you are aged 71 80 years and have a 30-pack-year history of smoking and currently smoke or have quit within the past 15 years. Yearly screening is stopped once you have quit smoking for at least 15 years or develop a health problem that would prevent you from having lung cancer treatment.  Clinical breast exam.** / Every year after age 74 years.  BRCA-related cancer risk assessment.** / For women who have family members with a BRCA-related cancer (breast, ovarian, tubal, or peritoneal cancers).  Mammogram.** / Every year beginning at age 72  years and continuing for as long as you are in good health. Consult with your health care provider.  Pap test.** / Every 3 years starting at age 40 years through age 32 or 56 years with a history of 3 consecutive normal Pap tests.  HPV screening.** / Every 3 years from ages 58 years through ages 52 to 12 years with a history of 3 consecutive normal Pap tests.  Fecal occult blood test (FOBT) of stool. / Every year beginning at age 33 years and continuing until age 62 years. You may not need to do this test if you get a colonoscopy every 10 years.  Flexible sigmoidoscopy or colonoscopy.** / Every 5 years for a flexible sigmoidoscopy or every 10 years for a colonoscopy beginning at age 29 years and continuing until age 73 years.  Hepatitis C blood test.** / For all people born from [birth year] through 1965 and any individual with known risks for hepatitis C.  Skin self-exam. / Monthly.  Influenza vaccine. / Every year.  Tetanus, diphtheria, and acellular pertussis (Tdap/Td) vaccine.** / Consult your health care provider. Pregnant women should receive 1 dose of Tdap vaccine during each pregnancy. 1 dose of Td every 10 years.  Varicella vaccine.** / Consult your health care provider. Pregnant females who do not have evidence of immunity should receive the first dose after pregnancy.  Zoster vaccine.** / 1 dose for adults aged 40 years or older.  Measles, mumps, rubella (MMR) vaccine.** / You need at least 1 dose of MMR if you were born in 1957 or later. You may also need a 2nd dose. For females of childbearing age, rubella immunity should be determined. If there is no evidence of immunity, females who are not pregnant should be vaccinated. If there is no evidence of immunity, females who are pregnant should delay immunization until after pregnancy.  Pneumococcal 13-valent conjugate (PCV13) vaccine.** / Consult your health care provider.  Pneumococcal polysaccharide (PPSV23) vaccine.** / 1 to 2 doses if  you smoke cigarettes or if you have certain conditions.  Meningococcal vaccine.** / Consult your health care provider.  Hepatitis A vaccine.** / Consult your health care provider.  Hepatitis B vaccine.** / Consult your health care provider.  Haemophilus influenzae type b (Hib) vaccine.** / Consult your health care provider.  Ages 28 years and over  Blood pressure check.** / Every 1 to 2 years.  Lipid and cholesterol check.** / Every 5 years beginning at age 69 years.  Lung cancer screening. / Every year if you are aged 16 80 years and have a 30-pack-year history of smoking and currently smoke or have quit within the past 15 years. Yearly screening is stopped once you have quit smoking for at least 15 years or develop a health problem that  would prevent you from having lung cancer treatment.  Clinical breast exam.** / Every year after age 103 years.  BRCA-related cancer risk assessment.** / For women who have family members with a BRCA-related cancer (breast, ovarian, tubal, or peritoneal cancers).  Mammogram.** / Every year beginning at age 36 years and continuing for as long as you are in good health. Consult with your health care provider.  Pap test.** / Every 3 years starting at age 5 years through age 85 or 10 years with 3 consecutive normal Pap tests. Testing can be stopped between 65 and 70 years with 3 consecutive normal Pap tests and no abnormal Pap or HPV tests in the past 10 years.  HPV screening.** / Every 3 years from ages 93 years through ages 70 or 45 years with a history of 3 consecutive normal Pap tests. Testing can be stopped between 65 and 70 years with 3 consecutive normal Pap tests and no abnormal Pap or HPV tests in the past 10 years.  Fecal occult blood test (FOBT) of stool. / Every year beginning at age 8 years and continuing until age 45 years. You may not need to do this test if you get a colonoscopy every 10 years.  Flexible sigmoidoscopy or colonoscopy.** /  Every 5 years for a flexible sigmoidoscopy or every 10 years for a colonoscopy beginning at age 69 years and continuing until age 68 years.  Hepatitis C blood test.** / For all people born from [birth year] through 1965 and any individual with known risks for hepatitis C.  Osteoporosis screening.** / A one-time screening for women ages 7 years and over and women at risk for fractures or osteoporosis.  Skin self-exam. / Monthly.  Influenza vaccine. / Every year.  Tetanus, diphtheria, and acellular pertussis (Tdap/Td) vaccine.** / 1 dose of Td every 10 years.  Varicella vaccine.** / Consult your health care provider.  Zoster vaccine.** / 1 dose for adults aged 5 years or older.  Pneumococcal 13-valent conjugate (PCV13) vaccine.** / Consult your health care provider.  Pneumococcal polysaccharide (PPSV23) vaccine.** / 1 dose for all adults aged 74 years and older.  Meningococcal vaccine.** / Consult your health care provider.  Hepatitis A vaccine.** / Consult your health care provider.  Hepatitis B vaccine.** / Consult your health care provider.  Haemophilus influenzae type b (Hib) vaccine.** / Consult your health care provider. ** Family history and personal history of risk and conditions may change your health care provider's recommendations. Document Released: 04/28/2001 Document Revised: 12/21/2012  [facility] Patient Information 2014 [place], [place].   EXERCISE AND DIET:  We recommended that you start or continue a regular exercise program for good health. Regular exercise means any activity that makes your heart beat faster and makes you sweat.  We recommend exercising at least 30 minutes per day at least 3 days a week, preferably 5.  We also recommend a diet low in fat and sugar / carbohydrates.  Inactivity, poor dietary choices and obesity can cause diabetes, heart attack, stroke, and kidney damage, among others.     ALCOHOL AND SMOKING:  Women should limit their alcohol intake to no  more than 7 drinks/beers/glasses of wine (combined, not each!) per week. Moderation of alcohol intake to this level decreases your risk of breast cancer and liver damage.  ( And of course, no recreational drugs are part of a healthy lifestyle.)  Also, you should not be smoking at all or even being exposed to second hand smoke. Most people know smoking can  cause cancer, and various heart and lung diseases, but did you know it also contributes to weakening of your bones?  Aging of your skin?  Yellowing of your teeth and nails?   CALCIUM AND VITAMIN D:  Adequate intake of calcium and Vitamin D are recommended.  The recommendations for exact amounts of these supplements seem to change often, but generally speaking 600 mg of calcium (either carbonate or citrate) and 800 units of Vitamin D per day seems prudent. Certain women may benefit from higher intake of Vitamin D.  If you are among these women, your doctor will have told you during your visit.     PAP SMEARS:  Pap smears, to check for cervical cancer or precancers,  have traditionally been done yearly, although recent scientific advances have shown that most women can have pap smears less often.  However, every woman still should have a physical exam from her gynecologist or primary care physician every year. It will include a breast check, inspection of the vulva and vagina to check for abnormal growths or skin changes, a visual exam of the cervix, and then an exam to evaluate the size and shape of the uterus and ovaries.  And after 42 years of age, a rectal exam is indicated to check for rectal cancers. We will also provide age appropriate advice regarding health maintenance, like when you should have certain vaccines, screening for sexually transmitted diseases, bone density testing, colonoscopy, mammograms, etc.    MAMMOGRAMS:  All women over 4 years old should have a yearly mammogram. Many facilities now offer a "3D" mammogram, which may cost  around $50 extra out of pocket. If possible,  we recommend you accept the option to have the 3D mammogram performed.  It both reduces the number of women who will be called back for extra views which then turn out to be normal, and it is better than the routine mammogram at detecting truly abnormal areas.     COLONOSCOPY:  Colonoscopy to screen for colon cancer is recommended for all women at age 38.  We know, you hate the idea of the prep.  We agree, BUT, having colon cancer and not knowing it is worse!!  Colon cancer so often starts as a polyp that can be seen and removed at colonscopy, which can quite literally save your life!  And if your first colonoscopy is normal and you have no family history of colon cancer, most women don't have to have it again for 10 years.  Once every ten years, you can do something that may end up saving your life, right?  We will be happy to help you get it scheduled when you are ready.  Be sure to check your insurance coverage so you understand how much it will cost.  It may be covered as a preventative service at no cost, but you should check your particular policy.   Overall labs look good, reduce saturated fat and increase frequency and intensity of walking to reduce LDL cholesterol. Increase water, stop tobacco use, and increase vit c to resolve sinus headache. If not improved, then please make office appt so we can further discuss. Please keep your OB/GYN appt next month. NICE TO SEE YOU!

## 2018-01-18 NOTE — Assessment & Plan Note (Signed)
Overall labs look good, reduce saturated fat and increase frequency and intensity of walking to reduce LDL cholesterol. Increase water, stop tobacco use, and increase vit c to resolve sinus headache. If not improved, then please make office appt so we can further discuss. Please keep your OB/GYN appt next month.

## 2018-01-25 ENCOUNTER — Encounter: Payer: Self-pay | Admitting: Adult Health

## 2018-01-26 ENCOUNTER — Other Ambulatory Visit: Payer: Self-pay | Admitting: Adult Health

## 2018-01-26 DIAGNOSIS — R42 Dizziness and giddiness: Secondary | ICD-10-CM

## 2018-01-26 NOTE — Progress Notes (Signed)
Referral to Neurology placed to address persistent dizziness

## 2018-02-15 DIAGNOSIS — Z1231 Encounter for screening mammogram for malignant neoplasm of breast: Secondary | ICD-10-CM | POA: Diagnosis not present

## 2018-02-15 DIAGNOSIS — Z6825 Body mass index (BMI) 25.0-25.9, adult: Secondary | ICD-10-CM | POA: Diagnosis not present

## 2018-02-15 DIAGNOSIS — Z124 Encounter for screening for malignant neoplasm of cervix: Secondary | ICD-10-CM | POA: Diagnosis not present

## 2018-02-15 DIAGNOSIS — Z01419 Encounter for gynecological examination (general) (routine) without abnormal findings: Secondary | ICD-10-CM | POA: Diagnosis not present

## 2018-02-15 LAB — HM PAP SMEAR: HM Pap smear: NORMAL

## 2018-02-16 ENCOUNTER — Other Ambulatory Visit: Payer: Self-pay | Admitting: Obstetrics and Gynecology

## 2018-02-16 DIAGNOSIS — R928 Other abnormal and inconclusive findings on diagnostic imaging of breast: Secondary | ICD-10-CM

## 2018-03-03 ENCOUNTER — Ambulatory Visit
Admission: RE | Admit: 2018-03-03 | Discharge: 2018-03-03 | Disposition: A | Discharge status: Home / Self Care | Payer: BLUE CROSS/BLUE SHIELD | Source: Ambulatory Visit | Attending: Obstetrics and Gynecology | Admitting: Obstetrics and Gynecology

## 2018-03-03 DIAGNOSIS — N6012 Diffuse cystic mastopathy of left breast: Secondary | ICD-10-CM | POA: Diagnosis not present

## 2018-03-03 DIAGNOSIS — R922 Inconclusive mammogram: Secondary | ICD-10-CM | POA: Diagnosis not present

## 2018-03-03 DIAGNOSIS — R928 Other abnormal and inconclusive findings on diagnostic imaging of breast: Secondary | ICD-10-CM

## 2018-03-03 DIAGNOSIS — N6011 Diffuse cystic mastopathy of right breast: Secondary | ICD-10-CM | POA: Diagnosis not present

## 2018-03-30 ENCOUNTER — Ambulatory Visit (INDEPENDENT_AMBULATORY_CARE_PROVIDER_SITE_OTHER): Payer: BLUE CROSS/BLUE SHIELD | Admitting: Neurology

## 2018-03-30 ENCOUNTER — Encounter: Payer: Self-pay | Admitting: Neurology

## 2018-03-30 VITALS — BP 110/74 | HR 100 | Ht 65.5 in | Wt 158.0 lb

## 2018-03-30 DIAGNOSIS — G8929 Other chronic pain: Secondary | ICD-10-CM

## 2018-03-30 DIAGNOSIS — R42 Dizziness and giddiness: Secondary | ICD-10-CM | POA: Diagnosis not present

## 2018-03-30 DIAGNOSIS — R519 Headache, unspecified: Secondary | ICD-10-CM

## 2018-03-30 DIAGNOSIS — H814 Vertigo of central origin: Secondary | ICD-10-CM

## 2018-03-30 DIAGNOSIS — R51 Headache: Secondary | ICD-10-CM | POA: Diagnosis not present

## 2018-03-30 MED ORDER — NORTRIPTYLINE HCL 10 MG PO CAPS: ORAL_CAPSULE | ORAL | 3 refills | Status: DC

## 2018-03-30 NOTE — Patient Instructions (Signed)
We will start nortriptyline at night for the dizziness.  Pamelor (nortriptyline) is an antidepressant medication that has many uses that may include headache, whiplash injuries, or for peripheral neuropathy pain. Side effects may include drowsiness, dry mouth, blurred vision, or constipation. As with any antidepressant medication, worsening depression may occur. If you had any significant side effects, please call our office. The full effects of this medication may take 7-10 days after starting the drug, or going up on the dose.

## 2018-03-30 NOTE — Progress Notes (Signed)
Reason for visit: Dizziness, headache  Referring physician: Dr. Romualdo Bolk is a 43 y.o. female  History of present illness:  Janice Silva is a 43 year old right-handed white female with a history of spontaneous onset of a dizzy or lightheaded sensation that began in August 2019.  The patient notes a pressure sensation inside the head that is persistent and parallels the lightheaded sensations that she gets.  The patient denies true vertigo, she does not have nausea or vomiting.  The pressure sensation and dizziness is oftentimes better in the morning, worse as the day goes on.  She has symptoms daily, she has had some chronic neck tightness, she has seen a chiropractor for this.  She has noted that she will get a several day benefit following her chiropractic treatments.  The patient denies any sinus drainage.  She reports no numbness or weakness of the face, arms, legs.  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  The patient is on birth control pills but there have been no recent changes in dosing of the birth control.  The patient reports some occasional blurring of vision, she mainly has a floaty sensation in the head that is worse with movement when she is up on her feet, but she may feel dizzy while sitting or while lying down.  The patient has an underlying sensation of fatigue, she indicates that she does sleep fairly well at night.  She may have good and bad days with the symptoms.  She denies any falls, she has not had any problems controlling the bowels or the bladder.  She is sent to this office for an evaluation.  She denies sinus drainage or allergy symptoms.  Past Medical History:  Diagnosis Date  . Dizziness   . Elevated WBC count   . Hyperventilation   . Migraine   . Nausea   . Numbness of feet   . Numbness of hand   . Palpitation   . Racing heart beat   . Thrombocytosis ([place])     Past Surgical History:  Procedure  Laterality Date  . MYRINGOTOMY WITH TUBE PLACEMENT    . TONSILLECTOMY AND ADENOIDECTOMY      Family History  Problem Relation Age of Onset  . Lupus Mother   . Healthy Father   . Healthy Sister   . Healthy Daughter   . Healthy Daughter   . Breast cancer Neg Hx     Social history:  reports that she has been smoking cigarettes. She has a 6.25 pack-year smoking history. She has never used smokeless tobacco. She reports current alcohol use. She reports that she does not use drugs.  Medications:  Prior to Admission medications   Medication Sig Start Date End Date Taking? Authorizing Provider  ferrous sulfate 325 (65 FE) MG tablet Take 325 mg by mouth daily with breakfast.   Yes [provider]  norethindrone-ethinyl estradiol (OVCON-35, 28,) 0.4-35 MG-MCG tablet Ovcon-35 (28)   Yes [provider]  vitamin B-12 (CYANOCOBALAMIN) 500 MCG tablet Take 500 mcg by mouth daily.   Yes [provider]     No Known Allergies  ROS:  Out of a complete 14 system review of symptoms, the patient complains only of the following symptoms, and all other reviewed systems are negative.  Weight gain, fatigue Dizziness Blurred vision Headache, weakness, dizziness Anxiety  Blood pressure 110/74, pulse 100, height 5' 5.5" (1.664 m), weight 158 lb (71.7 kg).  Physical Exam  General: The patient is alert and cooperative at the time of the examination.  Eyes: Pupils are equal, round, and reactive to light. Discs are flat bilaterally.  Venous pulsations were seen bilaterally.  Ears: Tympanic membranes are clear bilaterally.  Neck: The neck is supple, no carotid bruits are noted.  Respiratory: The respiratory examination is clear.  Cardiovascular: The cardiovascular examination reveals a regular rate and rhythm, no obvious murmurs or rubs are noted.  Neuromuscular: Range of movement the cervical spine is full.  Skin: Extremities are without significant  edema.  Neurologic Exam  Mental status: The patient is alert and oriented x 3 at the time of the examination. The patient has apparent normal recent and remote memory, with an apparently normal attention span and concentration ability.  Cranial nerves: Facial symmetry is present. There is good sensation of the face to pinprick and soft touch bilaterally. The strength of the facial muscles and the muscles to head turning and shoulder shrug are normal bilaterally. Speech is well enunciated, no aphasia or dysarthria is noted. Extraocular movements are full. Visual fields are full. The tongue is midline, and the patient has symmetric elevation of the soft palate. No obvious hearing deficits are noted.  Motor: The motor testing reveals 5 over 5 strength of all 4 extremities. Good symmetric motor tone is noted throughout.  Sensory: Sensory testing is intact to pinprick, soft touch, vibration sensation, and position sense on all 4 extremities. No evidence of extinction is noted.  Coordination: Cerebellar testing reveals good finger-nose-finger and heel-to-shin bilaterally.  Gait and station: Gait is normal. Tandem gait is normal. Romberg is negative. No drift is seen.  Reflexes: Deep tendon reflexes are symmetric and normal bilaterally. Toes are downgoing bilaterally.   Assessment/Plan:  1.  Chronic fullness sensation in the head, lightheaded sensations  The patient has had chronic daily undulating symptoms of fullness of the head associated with a lightheaded floaty sensation, not vertigo.  The most likely etiology of her symptoms is a form of a tension headache.  The patient was set up for MRI of the brain, she will be placed on nortriptyline in low-dose, she will follow-up in 3 months.  She will call for any dose adjustments of her medication.  Jill Alexanders MD 03/30/2018 8:36 AM  [facility] [address] [place], [place] [address]  Phone [phone]  Fax [phone]

## 2018-03-31 ENCOUNTER — Telehealth: Payer: Self-pay | Admitting: Neurology

## 2018-03-31 NOTE — Telephone Encounter (Signed)
Patient returned my call she is scheduled for 04/05/18 at [facility].

## 2018-03-31 NOTE — Telephone Encounter (Signed)
lvm for pt to call back about scheduling mri  [employer] Auth: [id number] (exp. 03/30/18 to 04/28/18)

## 2018-04-05 ENCOUNTER — Ambulatory Visit: Payer: BLUE CROSS/BLUE SHIELD

## 2018-04-05 DIAGNOSIS — H814 Vertigo of central origin: Secondary | ICD-10-CM | POA: Diagnosis not present

## 2018-04-05 DIAGNOSIS — R519 Headache, unspecified: Secondary | ICD-10-CM

## 2018-04-05 DIAGNOSIS — R51 Headache: Secondary | ICD-10-CM

## 2018-04-05 MED ORDER — GADOBENATE DIMEGLUMINE 529 MG/ML IV SOLN
14.0000 mL | Freq: Once | INTRAVENOUS | Status: AC | PRN
  Administered 2018-04-05: 14 mL via INTRAVENOUS

## 2018-04-06 ENCOUNTER — Telehealth: Payer: Self-pay | Admitting: Neurology

## 2018-04-06 NOTE — Telephone Encounter (Signed)
I called the patient.  MRI of the brain is unremarkable, the patient has an incidental pineal cyst, no compression is seen.   MRI brain 04/06/18:  IMPRESSION:   MRI brain (with and without) demonstrating: - No acute findings. - Incidental pineal region cyst.

## 2018-06-27 ENCOUNTER — Telehealth: Payer: Self-pay | Admitting: Neurology

## 2018-06-27 NOTE — Telephone Encounter (Signed)
Noted. E-mail has been sent to the patient's email on file with instructions on how to setup her webex meeting.

## 2018-06-27 NOTE — Telephone Encounter (Signed)
Pt called to change and consent for her appt to be a VV and  for the insurance to be billed as such. Email has been confirmed.

## 2018-06-28 NOTE — Progress Notes (Signed)
Virtual Visit via Video Note  I connected with Janice Silva on 06/28/18 at  9:15 AM EDT by a video enabled telemedicine application and verified that I am speaking with the correct person using two identifiers.   I discussed the limitations of evaluation and management by telemedicine and the availability of in person appointments. The patient expressed understanding and agreed to proceed.  History of Present Illness: 06/29/2018 SS: [name] is a 43 year old female with history of symptoms of fullness of the head, patient had a form of a tension headache.  She was started on nortriptyline in January 2020.  She had MRI of the brain in January 2020, was unremarkable, and incidental pineal cyst.  She is currently taking 30 mg of nortriptyline at bedtime.  She reports that the medication has helped her head sensation.  She reports that over the last few weeks, she thinks the medication may be wearing off, feeling the sensation of lightheadedness, dizziness inside her head.  She denies any nausea or vomiting.  She reports the head pressure has improved.  She noticed this mostly when she is walking, will feel like she is swaying.  She reports this will happen every few days.  Initially the nortriptyline was beneficial, recently the symptoms have started to return.  On the day she feels a sensation, it will happen on and off throughout the day.  She denies any numbness or weakness, problems with her vision. She is tolerating the medication well.  03/30/2018 Dr. Jannifer Franklin: [name] is a 43 year old right-handed white female with a history of spontaneous onset of a dizzy or lightheaded sensation that began in August 2019.  The patient notes a pressure sensation inside the head that is persistent and parallels the lightheaded sensations that she gets.  The patient denies true vertigo, she does not have nausea or vomiting.  The pressure sensation and dizziness is oftentimes better in the morning, worse as the day goes  on.  She has symptoms daily, she has had some chronic neck tightness, she has seen a chiropractor for this.  She has noted that she will get a several day benefit following her chiropractic treatments.  The patient denies any sinus drainage.  She reports no numbness or weakness of the face, arms, legs.  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  The patient is on birth control pills but there have been no recent changes in dosing of the birth control.  The patient reports some occasional blurring of vision, she mainly has a floaty sensation in the head that is worse with movement when she is up on her feet, but she may feel dizzy while sitting or while lying down.  The patient has an underlying sensation of fatigue, she indicates that she does sleep fairly well at night.  She may have good and bad days with the symptoms.  She denies any falls, she has not had any problems controlling the bowels or the bladder.  She is sent to this office for an evaluation.  She denies sinus drainage or allergy symptoms.   Observations/Objective: Alert, speech is clear and concise, answers questions, facial symmetry noted, symmetric eye movement, no arm drift  Assessment and Plan: 1.  Chronic fullness sensation in the head, lightheaded sensations  She continues to complain of a lightheaded, floaty sensation in her head.  She has found the nortriptyline to be beneficial.  Recently her symptoms have started to return, she thinks the medication may  be wearing off.  She can increase nortriptyline to 40 mg at bedtime.  I did send in a refill.  She also mentioned a referral to ENT to have her ears checked to see if that could be the etiology of the lightheaded sensations, I will place the referral today.   Follow Up Instructions:   4 to 6 months for revisit  I discussed the assessment and treatment plan with the patient. The patient was provided an opportunity to ask questions and all  were answered. The patient agreed with the plan and demonstrated an understanding of the instructions.   The patient was advised to call back or seek an in-person evaluation if the symptoms worsen or if the condition fails to improve as anticipated.  I provided 20 minutes of non-face-to-face time during this encounter.   Evangeline Dakin, DNP  [facility] [address] [place], [place] [address] [phone]

## 2018-06-29 ENCOUNTER — Ambulatory Visit (INDEPENDENT_AMBULATORY_CARE_PROVIDER_SITE_OTHER): Payer: BLUE CROSS/BLUE SHIELD | Admitting: Neurology

## 2018-06-29 ENCOUNTER — Encounter: Payer: Self-pay | Admitting: Neurology

## 2018-06-29 ENCOUNTER — Other Ambulatory Visit: Payer: Self-pay

## 2018-06-29 DIAGNOSIS — H814 Vertigo of central origin: Secondary | ICD-10-CM | POA: Diagnosis not present

## 2018-06-29 DIAGNOSIS — R42 Dizziness and giddiness: Secondary | ICD-10-CM | POA: Diagnosis not present

## 2018-06-29 MED ORDER — NORTRIPTYLINE HCL 10 MG PO CAPS: ORAL_CAPSULE | ORAL | 3 refills | Status: DC

## 2018-06-29 NOTE — Progress Notes (Signed)
I have read the note, and I agree with the clinical assessment and plan.  Verdine Grenfell K Rockwell Zentz   

## 2018-07-28 ENCOUNTER — Telehealth: Payer: Self-pay | Admitting: Neurology

## 2018-07-28 NOTE — Telephone Encounter (Signed)
I left a voicemail for the patient to call back to make her 4-6 month follow up with Lucita Ferrara.

## 2018-10-21 ENCOUNTER — Encounter: Payer: Self-pay | Admitting: Neurology

## 2018-10-21 ENCOUNTER — Encounter: Payer: Self-pay | Admitting: Adult Health

## 2018-11-09 ENCOUNTER — Telehealth: Payer: Self-pay | Admitting: Neurology

## 2018-11-09 MED ORDER — NORTRIPTYLINE HCL 10 MG PO CAPS: ORAL_CAPSULE | ORAL | 4 refills | Status: DC

## 2018-11-09 NOTE — Addendum Note (Signed)
Addended by: Brandon Melnick on: 11/09/2018 10:59 AM   Modules accepted: Orders

## 2018-11-09 NOTE — Telephone Encounter (Signed)
I called pt and relayed that yes it will be for 4 caps daily (40mg ).  I did for 4 months as RV in 11/20.

## 2018-11-09 NOTE — Telephone Encounter (Signed)
Pt is asking for enough refills on her nortriptyline (PAMELOR) 10 MG capsule ( [facility] )until her 4-6 month f/u in Nov.  Pt is also asking for a call to confirm the the script will continue to be for 4 a day

## 2019-01-19 ENCOUNTER — Other Ambulatory Visit: Payer: BLUE CROSS/BLUE SHIELD

## 2019-01-19 ENCOUNTER — Other Ambulatory Visit: Payer: Self-pay

## 2019-01-19 DIAGNOSIS — Z0000 Encounter for general adult medical examination without abnormal findings: Secondary | ICD-10-CM

## 2019-01-19 DIAGNOSIS — D75839 Thrombocytosis, unspecified: Secondary | ICD-10-CM

## 2019-01-19 DIAGNOSIS — D473 Essential (hemorrhagic) thrombocythemia: Secondary | ICD-10-CM

## 2019-01-20 ENCOUNTER — Other Ambulatory Visit: Payer: BC Managed Care – PPO

## 2019-01-20 ENCOUNTER — Other Ambulatory Visit: Payer: Self-pay

## 2019-01-20 DIAGNOSIS — R002 Palpitations: Secondary | ICD-10-CM | POA: Diagnosis not present

## 2019-01-20 DIAGNOSIS — D473 Essential (hemorrhagic) thrombocythemia: Secondary | ICD-10-CM | POA: Diagnosis not present

## 2019-01-20 DIAGNOSIS — Z0000 Encounter for general adult medical examination without abnormal findings: Secondary | ICD-10-CM

## 2019-01-20 DIAGNOSIS — D75839 Thrombocytosis, unspecified: Secondary | ICD-10-CM

## 2019-01-21 LAB — COMPREHENSIVE METABOLIC PANEL
ALT: 14 IU/L (ref 0–32)
AST: 16 IU/L (ref 0–40)
Albumin/Globulin Ratio: 1.6 (ref 1.2–2.2)
Albumin: 4.2 g/dL (ref 3.8–4.8)
Alkaline Phosphatase: 98 IU/L (ref 39–117)
BUN/Creatinine Ratio: 8 — ABNORMAL LOW (ref 9–23)
BUN: 8 mg/dL (ref 6–24)
Bilirubin Total: 0.2 mg/dL (ref 0.0–1.2)
CO2: 24 mmol/L (ref 20–29)
Calcium: 9.5 mg/dL (ref 8.7–10.2)
Chloride: 99 mmol/L (ref 96–106)
Creatinine, Ser: 0.98 mg/dL (ref 0.57–1.00)
GFR calc Af Amer: 82 mL/min/{1.73_m2} (ref >59)
GFR calc non Af Amer: 71 mL/min/{1.73_m2} (ref >59)
Globulin, Total: 2.6 g/dL (ref 1.5–4.5)
Glucose: 92 mg/dL (ref 65–99)
Potassium: 4.8 mmol/L (ref 3.5–5.2)
Sodium: 137 mmol/L (ref 134–144)
Total Protein: 6.8 g/dL (ref 6.0–8.5)

## 2019-01-21 LAB — LIPID PANEL
Chol/HDL Ratio: 4.8 ratio — ABNORMAL HIGH (ref 0.0–4.4)
Cholesterol, Total: 179 mg/dL (ref 100–199)
HDL: 37 mg/dL — ABNORMAL LOW (ref >39)
LDL Chol Calc (NIH): 110 mg/dL — ABNORMAL HIGH (ref 0–99)
Triglycerides: 184 mg/dL — ABNORMAL HIGH (ref 0–149)
VLDL Cholesterol Cal: 32 mg/dL (ref 5–40)

## 2019-01-21 LAB — CBC WITH DIFFERENTIAL/PLATELET
Basophils Absolute: 0 10*3/uL (ref 0.0–0.2)
Basos: 1 %
EOS (ABSOLUTE): 0.2 10*3/uL (ref 0.0–0.4)
Eos: 2 %
Hematocrit: 40.5 % (ref 34.0–46.6)
Hemoglobin: 13.6 g/dL (ref 11.1–15.9)
Immature Grans (Abs): 0 10*3/uL (ref 0.0–0.1)
Immature Granulocytes: 0 %
Lymphocytes Absolute: 2 10*3/uL (ref 0.7–3.1)
Lymphs: 24 %
MCH: 29.8 pg (ref 26.6–33.0)
MCHC: 33.6 g/dL (ref 31.5–35.7)
MCV: 89 fL (ref 79–97)
Monocytes Absolute: 0.6 10*3/uL (ref 0.1–0.9)
Monocytes: 7 %
Neutrophils Absolute: 5.5 10*3/uL (ref 1.4–7.0)
Neutrophils: 66 %
Platelets: 403 10*3/uL (ref 150–450)
RBC: 4.57 x10E6/uL (ref 3.77–5.28)
RDW: 12.5 % (ref 11.7–15.4)
WBC: 8.4 10*3/uL (ref 3.4–10.8)

## 2019-01-21 LAB — HEMOGLOBIN A1C
Est. average glucose Bld gHb Est-mCnc: 105 mg/dL
Hgb A1c MFr Bld: 5.3 % (ref 4.8–5.6)

## 2019-01-21 LAB — TSH: TSH: 1.12 u[IU]/mL (ref 0.450–4.500)

## 2019-01-23 ENCOUNTER — Other Ambulatory Visit: Payer: Self-pay

## 2019-01-23 ENCOUNTER — Ambulatory Visit (INDEPENDENT_AMBULATORY_CARE_PROVIDER_SITE_OTHER): Payer: BC Managed Care – PPO | Admitting: Adult Health

## 2019-01-23 ENCOUNTER — Encounter: Payer: Self-pay | Admitting: Adult Health

## 2019-01-23 DIAGNOSIS — Z716 Tobacco abuse counseling: Secondary | ICD-10-CM

## 2019-01-23 DIAGNOSIS — E7800 Pure hypercholesterolemia, unspecified: Secondary | ICD-10-CM | POA: Insufficient documentation

## 2019-01-23 DIAGNOSIS — Z0000 Encounter for general adult medical examination without abnormal findings: Secondary | ICD-10-CM | POA: Diagnosis not present

## 2019-01-23 NOTE — Assessment & Plan Note (Signed)
Remain well hydrated. To reduce Triglycerides and LDL (bad) cholesterol- follow Mediterranean diet and increase regular exercise.  Recommend at least 30 minutes daily, 5 days per week of walking, jogging, biking, swimming, YouTube/Pinterest workout videos. Continue all medications as directed. Continue follow-up with OB/GYN and Neurology as directed. Please schedule fasting lab appt in 4 months, to re-check cholesterol panel. Recommend annual physical with fasting labs in 1 year. Continue to social distance and wear a mask when in public.

## 2019-01-23 NOTE — Progress Notes (Signed)
Subjective:    Patient ID: Janice Silva, female    DOB: [birth year], 43 y.o.   MRN: [id number]  HPI:  Ms. Sturms is here for CPE She reports snacking more and also eating larger portion sizes at meals. She has gained >15 lbs since 03/30/2018 She reports she has stopped all exercise- however plans in resuming regular walking and home elliptical. She rarely drinks ETOH. Currently smoking 6-7 cigarettes/day She has tried Nicoderm patch in the patch- declined re-starting today. She is followed by [facility] Reviewed 01/20/2019 Labs- CBC-stable CMP-stable TSH-WNL A1c-WNL 5.3 Recommend Mediterranean diet and increasing regular exercise to reduce TGs, LDL, and increase HDL- Recommend repeating Lipid panel in 4 months. She denies first degree family hx of MI/CAD/CVA The 10-year ASCVD risk score Mikey Bussing DC Brooke Bonito., et al., 2013) is: 3.1%   Values used to calculate the score:     Age: 38 years     Sex: Female     Is Non-Hispanic African American: No     Diabetic: No     Tobacco smoker: Yes     Systolic Blood Pressure: A999333 mmHg     Is BP treated: No     HDL Cholesterol: 37 mg/dL     Total Cholesterol: 179 mg/dL  LDL-110  Healthcare Maintenance: PAP- UTD, 12/2017- last normal per pt- tracking down- she has annual OB/GYN appt each Dec Mammogram-Due in Dec , last 03/03/18-will update next month at her OB/GYN appt  Patient Care Team    Relationship Specialty Notifications Start End  Mina Marble D, NP PCP - General Family Medicine  12/21/17   Vanessa Kick, MD Consulting Physician Obstetrics and Gynecology  12/23/17     Patient Active Problem List   Diagnosis Date Noted  . Elevated LDL cholesterol level 01/23/2019  . 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 Healthcare maintenance 12/21/2017  . Encounter for tobacco use cessation counseling 12/21/2017  . Abnormal EKG 11/19/2014  . Thrombocytosis ([place]) 09/16/2014  . Palpitations 09/16/2014     Past Medical History:  Diagnosis Date  .  Dizziness   . Elevated WBC count   . Hyperventilation   . Migraine   . Nausea   . Numbness of feet   . Numbness of hand   . Palpitation   . Racing heart beat   . Thrombocytosis ([place])      Past Surgical History:  Procedure Laterality Date  . MYRINGOTOMY WITH TUBE PLACEMENT    . TONSILLECTOMY AND ADENOIDECTOMY       Family History  Problem Relation Age of Onset  . Lupus Mother   . Healthy Father   . Healthy Sister   . Healthy Daughter   . Healthy Daughter   . Breast cancer Neg Hx      Social History   Substance and Sexual Activity  Drug Use No     Social History   Substance and Sexual Activity  Alcohol Use Yes  . Alcohol/week: 0.0 standard drinks   Comment: social     Social History   Tobacco Use  Smoking Status Current Some Day Smoker  . Packs/day: 0.25  . Years: 25.00  . Pack years: 6.25  . Types: Cigarettes  Smokeless Tobacco Never Used     Outpatient Encounter Medications as of 01/23/2019  Medication Sig  . ferrous sulfate 325 (65 FE) MG tablet Take 325 mg by mouth daily with breakfast.  . norethindrone-ethinyl estradiol (OVCON-35, 28,) 0.4-35 MG-MCG tablet Ovcon-35 (28)  . nortriptyline (PAMELOR) 10 MG capsule  Take 4 tablets at bedtime (40 mg)  . vitamin B-12 (CYANOCOBALAMIN) 500 MCG tablet Take 500 mcg by mouth daily.   No facility-administered encounter medications on file as of 01/23/2019.     Allergies: Patient has no known allergies.  Body mass index is 28.38 kg/m.  Blood pressure 110/74, pulse 92, temperature 98.5 F (36.9 C), temperature source Oral, height 5' 5.5" (1.664 m), weight 173 lb 3.2 oz (78.6 kg), SpO2 99 %.   Review of Systems  Constitutional: Positive for fatigue. Negative for activity change, appetite change, chills, diaphoresis, fever and unexpected weight change.  HENT: Negative for congestion.   Eyes: Negative for visual disturbance.  Respiratory: Negative for cough, chest tightness, shortness of breath,  wheezing and stridor.   Cardiovascular: Negative for chest pain, palpitations and leg swelling.  Gastrointestinal: Negative for abdominal distention, anal bleeding, blood in stool, constipation, diarrhea, nausea and vomiting.  Endocrine: Negative for polydipsia, polyphagia and polyuria.  Genitourinary: Negative for difficulty urinating and flank pain.  Musculoskeletal: Negative for arthralgias, back pain, gait problem, joint swelling, myalgias, neck pain and neck stiffness.  Neurological: Negative for dizziness and headaches.  Hematological: Negative for adenopathy. Does not bruise/bleed easily.  Psychiatric/Behavioral: Negative for agitation, behavioral problems, confusion, decreased concentration, dysphoric mood, hallucinations, self-injury, sleep disturbance and suicidal ideas. The patient is not nervous/anxious and is not hyperactive.        Objective:   Physical Exam Vitals signs and nursing note reviewed.  Constitutional:      General: She is not in acute distress.    Appearance: Normal appearance. She is not ill-appearing, toxic-appearing or diaphoretic.  HENT:     Head: Normocephalic and atraumatic.     Right Ear: Tympanic membrane, ear canal and external ear normal. There is no impacted cerumen.     Left Ear: Tympanic membrane, ear canal and external ear normal. There is no impacted cerumen.     Nose: Nose normal. No congestion.     Mouth/Throat:     Mouth: Mucous membranes are moist.     Pharynx: No oropharyngeal exudate.  Eyes:     Extraocular Movements: Extraocular movements intact.     Conjunctiva/sclera: Conjunctivae normal.     Pupils: Pupils are equal, round, and reactive to light.  Neck:     Musculoskeletal: Normal range of motion and neck supple. No muscular tenderness.  Cardiovascular:     Rate and Rhythm: Normal rate and regular rhythm.     Pulses: Normal pulses.     Heart sounds: Normal heart sounds. No murmur. No friction rub. No gallop.   Pulmonary:      Effort: Pulmonary effort is normal. No respiratory distress.     Breath sounds: Normal breath sounds. No stridor. No wheezing, rhonchi or rales.  Chest:     Chest wall: No tenderness.  Abdominal:     General: Abdomen is protuberant. Bowel sounds are normal.     Palpations: Abdomen is soft.     Tenderness: There is no abdominal tenderness. There is no right CVA tenderness or left CVA tenderness.  Musculoskeletal: Normal range of motion.        General: No tenderness.  Skin:    General: Skin is warm and dry.     Capillary Refill: Capillary refill takes less than 2 seconds.  Neurological:     Mental Status: She is alert and oriented to person, place, and time.  Psychiatric:        Mood and Affect: Mood normal.  Behavior: Behavior normal.        Thought Content: Thought content normal.        Judgment: Judgment normal.       Assessment & Plan:   1. Healthcare maintenance   2. Encounter for tobacco use cessation counseling   3. Elevated LDL cholesterol level     Healthcare maintenance Remain well hydrated. To reduce Triglycerides and LDL (bad) cholesterol- follow Mediterranean diet and increase regular exercise.  Recommend at least 30 minutes daily, 5 days per week of walking, jogging, biking, swimming, YouTube/Pinterest workout videos. Continue all medications as directed. Continue follow-up with OB/GYN and Neurology as directed. Please schedule fasting lab appt in 4 months, to re-check cholesterol panel. Recommend annual physical with fasting labs in 1 year. Continue to social distance and wear a mask when in public.  Encounter for tobacco use cessation counseling Currently smoking 6-7 cigarettes She has tried Nicoderm patch in the patch- declined re-starting today.  Elevated LDL cholesterol level The 10-year ASCVD risk score Mikey Bussing DC Jr., et al., 2013) is: 3.1%   Values used to calculate the score:     Age: 37 years     Sex: Female     Is Non-Hispanic African  American: No     Diabetic: No     Tobacco smoker: Yes     Systolic Blood Pressure: A999333 mmHg     Is BP treated: No     HDL Cholesterol: 37 mg/dL     Total Cholesterol: 179 mg/dL LDL-110 Encouraged lifestyle Re-check Lipids in 4 months    FOLLOW-UP:  Return in about 4 months (around 05/23/2019) for Fasting Labs.

## 2019-01-23 NOTE — Patient Instructions (Addendum)
Preventive Care for Adults, Female  A healthy lifestyle and preventive care can promote health and wellness. Preventive health guidelines for women include the following key practices.   A routine yearly physical is a good way to check with your health care provider about your health and preventive screening. It is a chance to share any concerns and updates on your health and to receive a thorough exam.   Visit your dentist for a routine exam and preventive care every 6 months. Brush your teeth twice a day and floss once a day. Good oral hygiene prevents tooth decay and gum disease.   The frequency of eye exams is based on your age, health, family medical history, use of contact lenses, and other factors. Follow your health care provider's recommendations for frequency of eye exams.   Eat a healthy diet. Foods like vegetables, fruits, whole grains, low-fat dairy products, and lean protein foods contain the nutrients you need without too many calories. Decrease your intake of foods high in solid fats, added sugars, and salt. Eat the right amount of calories for you.Get information about a proper diet from your health care provider, if necessary.   Regular physical exercise is one of the most important things you can do for your health. Most adults should get at least 150 minutes of moderate-intensity exercise (any activity that increases your heart rate and causes you to sweat) each week. In addition, most adults need muscle-strengthening exercises on 2 or more days a week.   Maintain a healthy weight. The body mass index (BMI) is a screening tool to identify possible weight problems. It provides an estimate of body fat based on height and weight. Your health care provider can find your BMI, and can help you achieve or maintain a healthy weight.For adults 20 years and older:   - A BMI below 18.5 is considered underweight.   - A BMI of 18.5 to 24.9 is normal.   - A BMI of 25 to 29.9 is  considered overweight.   - A BMI of 30 and above is considered obese.   Maintain normal blood lipids and cholesterol levels by exercising and minimizing your intake of trans and saturated fats.  Eat a balanced diet with plenty of fruit and vegetables. Blood tests for lipids and cholesterol should begin at age 20 and be repeated every 5 years minimum.  If your lipid or cholesterol levels are high, you are over 40, or you are at high risk for heart disease, you may need your cholesterol levels checked more frequently.Ongoing high lipid and cholesterol levels should be treated with medicines if diet and exercise are not working.   If you smoke, find out from your health care provider how to quit. If you do not use tobacco, do not start.   Lung cancer screening is recommended for adults aged 55-80 years who are at high risk for developing lung cancer because of a history of smoking. A yearly low-dose CT scan of the lungs is recommended for people who have at least a 30-pack-year history of smoking and are a current smoker or have quit within the past 15 years. A pack year of smoking is smoking an average of 1 pack of cigarettes a day for 1 year (for example: 1 pack a day for 30 years or 2 packs a day for 15 years). Yearly screening should continue until the smoker has stopped smoking for at least 15 years. Yearly screening should be stopped for people who develop a   health problem that would prevent them from having lung cancer treatment.   If you are pregnant, do not drink alcohol. If you are breastfeeding, be very cautious about drinking alcohol. If you are not pregnant and choose to drink alcohol, do not have more than 1 drink per day. One drink is considered to be 12 ounces (355 mL) of beer, 5 ounces (148 mL) of wine, or 1.5 ounces (44 mL) of liquor.   Avoid use of street drugs. Do not share needles with anyone. Ask for help if you need support or instructions about stopping the use of  drugs.   High blood pressure causes heart disease and increases the risk of stroke. Your blood pressure should be checked at least yearly.  Ongoing high blood pressure should be treated with medicines if weight loss and exercise do not work.   If you are 69-55 years old, ask your health care provider if you should take aspirin to prevent strokes.   Diabetes screening involves taking a blood sample to check your fasting blood sugar level. This should be done once every 3 years, after age 38, if you are within normal weight and without risk factors for diabetes. Testing should be considered at a younger age or be carried out more frequently if you are overweight and have at least 1 risk factor for diabetes.   Breast cancer screening is essential preventive care for women. You should practice "breast self-awareness."  This means understanding the normal appearance and feel of your breasts and may include breast self-examination.  Any changes detected, no matter how small, should be reported to a health care provider.  Women in their 80s and 30s should have a clinical breast exam (CBE) by a health care provider as part of a regular health exam every 1 to 3 years.  After age 66, women should have a CBE every year.  Starting at age 1, women should consider having a mammogram (breast X-ray test) every year.  Women who have a family history of breast cancer should talk to their health care provider about genetic screening.  Women at a high risk of breast cancer should talk to their health care providers about having an MRI and a mammogram every year.   -Breast cancer gene (BRCA)-related cancer risk assessment is recommended for women who have family members with BRCA-related cancers. BRCA-related cancers include breast, ovarian, tubal, and peritoneal cancers. Having family members with these cancers may be associated with an increased risk for harmful changes (mutations) in the breast cancer genes BRCA1 and  BRCA2. Results of the assessment will determine the need for genetic counseling and BRCA1 and BRCA2 testing.   The Pap test is a screening test for cervical cancer. A Pap test can show cell changes on the cervix that might become cervical cancer if left untreated. A Pap test is a procedure in which cells are obtained and examined from the lower end of the uterus (cervix).   - Women should have a Pap test starting at age 57.   - Between ages 90 and 70, Pap tests should be repeated every 2 years.   - Beginning at age 63, you should have a Pap test every 3 years as long as the past 3 Pap tests have been normal.   - Some women have medical problems that increase the chance of getting cervical cancer. Talk to your health care provider about these problems. It is especially important to talk to your health care provider if a  new problem develops soon after your last Pap test. In these cases, your health care provider may recommend more frequent screening and Pap tests.   - The above recommendations are the same for women who have or have not gotten the vaccine for human papillomavirus (HPV).   - If you had a hysterectomy for a problem that was not cancer or a condition that could lead to cancer, then you no longer need Pap tests. Even if you no longer need a Pap test, a regular exam is a good idea to make sure no other problems are starting.   - If you are between ages 36 and 66 years, and you have had normal Pap tests going back 10 years, you no longer need Pap tests. Even if you no longer need a Pap test, a regular exam is a good idea to make sure no other problems are starting.   - If you have had past treatment for cervical cancer or a condition that could lead to cancer, you need Pap tests and screening for cancer for at least 20 years after your treatment.   - If Pap tests have been discontinued, risk factors (such as a new sexual partner) need to be reassessed to determine if screening should  be resumed.   - The HPV test is an additional test that may be used for cervical cancer screening. The HPV test looks for the virus that can cause the cell changes on the cervix. The cells collected during the Pap test can be tested for HPV. The HPV test could be used to screen women aged 70 years and older, and should be used in women of any age who have unclear Pap test results. After the age of 67, women should have HPV testing at the same frequency as a Pap test.   Colorectal cancer can be detected and often prevented. Most routine colorectal cancer screening begins at the age of 57 years and continues through age 26 years. However, your health care provider may recommend screening at an earlier age if you have risk factors for colon cancer. On a yearly basis, your health care provider may provide home test kits to check for hidden blood in the stool.  Use of a small camera at the end of a tube, to directly examine the colon (sigmoidoscopy or colonoscopy), can detect the earliest forms of colorectal cancer. Talk to your health care provider about this at age 23, when routine screening begins. Direct exam of the colon should be repeated every 5 -10 years through age 49 years, unless early forms of pre-cancerous polyps or small growths are found.   People who are at an increased risk for hepatitis B should be screened for this virus. You are considered at high risk for hepatitis B if:  -You were born in a country where hepatitis B occurs often. Talk with your health care provider about which countries are considered high risk.  - Your parents were born in a high-risk country and you have not received a shot to protect against hepatitis B (hepatitis B vaccine).  - You have HIV or AIDS.  - You use needles to inject street drugs.  - You live with, or have sex with, someone who has Hepatitis B.  - You get hemodialysis treatment.  - You take certain medicines for conditions like cancer, organ  transplantation, and autoimmune conditions.   Hepatitis C blood testing is recommended for all people born from [birth year] through 1965 and any individual  with known risks for hepatitis C.   Practice safe sex. Use condoms and avoid high-risk sexual practices to reduce the spread of sexually transmitted infections (STIs). STIs include gonorrhea, chlamydia, syphilis, trichomonas, herpes, HPV, and human immunodeficiency virus (HIV). Herpes, HIV, and HPV are viral illnesses that have no cure. They can result in disability, cancer, and death. Sexually active women aged 25 years and younger should be checked for chlamydia. Older women with new or multiple partners should also be tested for chlamydia. Testing for other STIs is recommended if you are sexually active and at increased risk.   Osteoporosis is a disease in which the bones lose minerals and strength with aging. This can result in serious bone fractures or breaks. The risk of osteoporosis can be identified using a bone density scan. Women ages 65 years and over and women at risk for fractures or osteoporosis should discuss screening with their health care providers. Ask your health care provider whether you should take a calcium supplement or vitamin D to There are also several preventive steps women can take to avoid osteoporosis and resulting fractures or to keep osteoporosis from worsening. -->Recommendations include:  Eat a balanced diet high in fruits, vegetables, calcium, and vitamins.  Get enough calcium. The recommended total intake of is 1,200 mg daily; for best absorption, if taking supplements, divide doses into 250-500 mg doses throughout the day. Of the two types of calcium, calcium carbonate is best absorbed when taken with food but calcium citrate can be taken on an empty stomach.  Get enough vitamin D. NAMS and the National Osteoporosis Foundation recommend at least 1,000 IU per day for women age 50 and over who are at risk of vitamin D  deficiency. Vitamin D deficiency can be caused by inadequate sun exposure (for example, those who live in northern latitudes).  Avoid alcohol and smoking. Heavy alcohol intake (more than 7 drinks per week) increases the risk of falls and hip fracture and women smokers tend to lose bone more rapidly and have lower bone mass than nonsmokers. Stopping smoking is one of the most important changes women can make to improve their health and decrease risk for disease.  Be physically active every day. Weight-bearing exercise (for example, fast walking, hiking, jogging, and weight training) may strengthen bones or slow the rate of bone loss that comes with aging. Balancing and muscle-strengthening exercises can reduce the risk of falling and fracture.  Consider therapeutic medications. Currently, several types of effective drugs are available. Healthcare providers can recommend the type most appropriate for each woman.  Eliminate environmental factors that may contribute to accidents. Falls cause nearly 90% of all osteoporotic fractures, so reducing this risk is an important bone-health strategy. Measures include ample lighting, removing obstructions to walking, using nonskid rugs on floors, and placing mats and/or grab bars in showers.  Be aware of medication side effects. Some common medicines make bones weaker. These include a type of steroid drug called glucocorticoids used for arthritis and asthma, some antiseizure drugs, certain sleeping pills, treatments for endometriosis, and some cancer drugs. An overactive thyroid gland or using too much thyroid hormone for an underactive thyroid can also be a problem. If you are taking these medicines, talk to your doctor about what you can do to help protect your bones.reduce the rate of osteoporosis.    Menopause can be associated with physical symptoms and risks. Hormone replacement therapy is available to decrease symptoms and risks. You should talk to your  health care provider   about whether hormone replacement therapy is right for you.   Use sunscreen. Apply sunscreen liberally and repeatedly throughout the day. You should seek shade when your shadow is shorter than you. Protect yourself by wearing long sleeves, pants, a wide-brimmed hat, and sunglasses year round, whenever you are outdoors.   Once a month, do a whole body skin exam, using a mirror to look at the skin on your back. Tell your health care provider of new moles, moles that have irregular borders, moles that are larger than a pencil eraser, or moles that have changed in shape or color.   -Stay current with required vaccines (immunizations).   Influenza vaccine. All adults should be immunized every year.  Tetanus, diphtheria, and acellular pertussis (Td, Tdap) vaccine. Pregnant women should receive 1 dose of Tdap vaccine during each pregnancy. The dose should be obtained regardless of the length of time since the last dose. Immunization is preferred during the 27th 36th week of gestation. An adult who has not previously received Tdap or who does not know her vaccine status should receive 1 dose of Tdap. This initial dose should be followed by tetanus and diphtheria toxoids (Td) booster doses every 10 years. Adults with an unknown or incomplete history of completing a 3-dose immunization series with Td-containing vaccines should begin or complete a primary immunization series including a Tdap dose. Adults should receive a Td booster every 10 years.  Varicella vaccine. An adult without evidence of immunity to varicella should receive 2 doses or a second dose if she has previously received 1 dose. Pregnant females who do not have evidence of immunity should receive the first dose after pregnancy. This first dose should be obtained before leaving the health care facility. The second dose should be obtained 4 8 weeks after the first dose.  Human papillomavirus (HPV) vaccine. Females aged 13 26  years who have not received the vaccine previously should obtain the 3-dose series. The vaccine is not recommended for use in pregnant females. However, pregnancy testing is not needed before receiving a dose. If a female is found to be pregnant after receiving a dose, no treatment is needed. In that case, the remaining doses should be delayed until after the pregnancy. Immunization is recommended for any person with an immunocompromised condition through the age of 26 years if she did not get any or all doses earlier. During the 3-dose series, the second dose should be obtained 4 8 weeks after the first dose. The third dose should be obtained 24 weeks after the first dose and 16 weeks after the second dose.  Zoster vaccine. One dose is recommended for adults aged 60 years or older unless certain conditions are present.  Measles, mumps, and rubella (MMR) vaccine. Adults born before 1957 generally are considered immune to measles and mumps. Adults born in 1957 or later should have 1 or more doses of MMR vaccine unless there is a contraindication to the vaccine or there is laboratory evidence of immunity to each of the three diseases. A routine second dose of MMR vaccine should be obtained at least 28 days after the first dose for students attending postsecondary schools, health care workers, or international travelers. People who received inactivated measles vaccine or an unknown type of measles vaccine during 1963 1967 should receive 2 doses of MMR vaccine. People who received inactivated mumps vaccine or an unknown type of mumps vaccine before 1979 and are at high risk for mumps infection should consider immunization with 2 doses of   MMR vaccine. For females of childbearing age, rubella immunity should be determined. If there is no evidence of immunity, females who are not pregnant should be vaccinated. If there is no evidence of immunity, females who are pregnant should delay immunization until after pregnancy.  Unvaccinated health care workers born before [birth year] who lack laboratory evidence of measles, mumps, or rubella immunity or laboratory confirmation of disease should consider measles and mumps immunization with 2 doses of MMR vaccine or rubella immunization with 1 dose of MMR vaccine.  Pneumococcal 13-valent conjugate (PCV13) vaccine. When indicated, a person who is uncertain of her immunization history and has no record of immunization should receive the PCV13 vaccine. An adult aged 54 years or older who has certain medical conditions and has not been previously immunized should receive 1 dose of PCV13 vaccine. This PCV13 should be followed with a dose of pneumococcal polysaccharide (PPSV23) vaccine. The PPSV23 vaccine dose should be obtained at least 8 weeks after the dose of PCV13 vaccine. An adult aged 58 years or older who has certain medical conditions and previously received 1 or more doses of PPSV23 vaccine should receive 1 dose of PCV13. The PCV13 vaccine dose should be obtained 1 or more years after the last PPSV23 vaccine dose.  Pneumococcal polysaccharide (PPSV23) vaccine. When PCV13 is also indicated, PCV13 should be obtained first. All adults aged 58 years and older should be immunized. An adult younger than age 65 years who has certain medical conditions should be immunized. Any person who resides in a nursing home or long-term care facility should be immunized. An adult smoker should be immunized. People with an immunocompromised condition and certain other conditions should receive both PCV13 and PPSV23 vaccines. People with human immunodeficiency virus (HIV) infection should be immunized as soon as possible after diagnosis. Immunization during chemotherapy or radiation therapy should be avoided. Routine use of PPSV23 vaccine is not recommended for American Indians, [place] Natives, or people younger than 65 years unless there are medical conditions that require PPSV23 vaccine. When indicated,  people who have unknown immunization and have no record of immunization should receive PPSV23 vaccine. One-time revaccination 5 years after the first dose of PPSV23 is recommended for people aged 70 64 years who have chronic kidney failure, nephrotic syndrome, asplenia, or immunocompromised conditions. People who received 1 2 doses of PPSV23 before age 32 years should receive another dose of PPSV23 vaccine at age 96 years or later if at least 5 years have passed since the previous dose. Doses of PPSV23 are not needed for people immunized with PPSV23 at or after age 55 years.  Meningococcal vaccine. Adults with asplenia or persistent complement component deficiencies should receive 2 doses of quadrivalent meningococcal conjugate (MenACWY-D) vaccine. The doses should be obtained at least 2 months apart. Microbiologists working with certain meningococcal bacteria, [employer] recruits, people at risk during an outbreak, and people who travel to or live in countries with a high rate of meningitis should be immunized. A first-year college student up through age 58 years who is living in a residence hall should receive a dose if she did not receive a dose on or after her 16th birthday. Adults who have certain high-risk conditions should receive one or more doses of vaccine.  Hepatitis A vaccine. Adults who wish to be protected from this disease, have certain high-risk conditions, work with hepatitis A-infected animals, work in hepatitis A research labs, or travel to or work in countries with a high rate of hepatitis A should be  immunized. Adults who were previously unvaccinated and who anticipate close contact with an international adoptee during the first 60 days after arrival in the [place] from a country with a high rate of hepatitis A should be immunized.  Hepatitis B vaccine.  Adults who wish to be protected from this disease, have certain high-risk conditions, may be exposed to blood or other infectious  body fluids, are household contacts or sex partners of hepatitis B positive people, are clients or workers in certain care facilities, or travel to or work in countries with a high rate of hepatitis B should be immunized.  Haemophilus influenzae type b (Hib) vaccine. A previously unvaccinated person with asplenia or sickle cell disease or having a scheduled splenectomy should receive 1 dose of Hib vaccine. Regardless of previous immunization, a recipient of a hematopoietic stem cell transplant should receive a 3-dose series 6 12 months after her successful transplant. Hib vaccine is not recommended for adults with HIV infection.  Preventive Services / Frequency Ages 6 to 39years  Blood pressure check.** / Every 1 to 2 years.  Lipid and cholesterol check.** / Every 5 years beginning at age 39.  Clinical breast exam.** / Every 3 years for women in their 61s and 62s.  BRCA-related cancer risk assessment.** / For women who have family members with a BRCA-related cancer (breast, ovarian, tubal, or peritoneal cancers).  Pap test.** / Every 2 years from ages 47 through 85. Every 3 years starting at age 34 through age 12 or 74 with a history of 3 consecutive normal Pap tests.  HPV screening.** / Every 3 years from ages 46 through ages 43 to 54 with a history of 3 consecutive normal Pap tests.  Hepatitis C blood test.** / For any individual with known risks for hepatitis C.  Skin self-exam. / Monthly.  Influenza vaccine. / Every year.  Tetanus, diphtheria, and acellular pertussis (Tdap, Td) vaccine.** / Consult your health care provider. Pregnant women should receive 1 dose of Tdap vaccine during each pregnancy. 1 dose of Td every 10 years.  Varicella vaccine.** / Consult your health care provider. Pregnant females who do not have evidence of immunity should receive the first dose after pregnancy.  HPV vaccine. / 3 doses over 6 months, if 64 and younger. The vaccine is not recommended for use in  pregnant females. However, pregnancy testing is not needed before receiving a dose.  Measles, mumps, rubella (MMR) vaccine.** / You need at least 1 dose of MMR if you were born in 1957 or later. You may also need a 2nd dose. For females of childbearing age, rubella immunity should be determined. If there is no evidence of immunity, females who are not pregnant should be vaccinated. If there is no evidence of immunity, females who are pregnant should delay immunization until after pregnancy.  Pneumococcal 13-valent conjugate (PCV13) vaccine.** / Consult your health care provider.  Pneumococcal polysaccharide (PPSV23) vaccine.** / 1 to 2 doses if you smoke cigarettes or if you have certain conditions.  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 You may also need additional booster doses.  Hepatitis A vaccine.** / Consult your health care provider.  Hepatitis B vaccine.** / Consult your health care provider.  Haemophilus influenzae type b (Hib) vaccine.** / Consult your health care provider.  Ages 55 to 64years  Blood pressure check.** / Every 1 to 2 years.  Lipid and cholesterol check.** / Every 5 years beginning at age 20 years.  Lung cancer screening. / Every year if you are aged 55 80 years and have a 30-pack-year history of smoking and currently smoke or have quit within the past 15 years. Yearly screening is stopped once you have quit smoking for at least 15 years or develop a health problem that would prevent you from having lung cancer treatment.  Clinical breast exam.** / Every year after age 40 years.  BRCA-related cancer risk assessment.** / For women who have family members with a BRCA-related cancer (breast, ovarian, tubal, or peritoneal cancers).  Mammogram.** / Every year beginning at age 40  years and continuing for as long as you are in good health. Consult with your health care provider.  Pap test.** / Every 3 years starting at age 30 years through age 65 or 70 years with a history of 3 consecutive normal Pap tests.  HPV screening.** / Every 3 years from ages 30 years through ages 65 to 70 years with a history of 3 consecutive normal Pap tests.  Fecal occult blood test (FOBT) of stool. / Every year beginning at age 50 years and continuing until age 75 years. You may not need to do this test if you get a colonoscopy every 10 years.  Flexible sigmoidoscopy or colonoscopy.** / Every 5 years for a flexible sigmoidoscopy or every 10 years for a colonoscopy beginning at age 50 years and continuing until age 75 years.  Hepatitis C blood test.** / For all people born from 1945 through 1965 and any individual with known risks for hepatitis C.  Skin self-exam. / Monthly.  Influenza vaccine. / Every year.  Tetanus, diphtheria, and acellular pertussis (Tdap/Td) vaccine.** / Consult your health care provider. Pregnant women should receive 1 dose of Tdap vaccine during each pregnancy. 1 dose of Td every 10 years.  Varicella vaccine.** / Consult your health care provider. Pregnant females who do not have evidence of immunity should receive the first dose after pregnancy.  Zoster vaccine.** / 1 dose for adults aged 60 years or older.  Measles, mumps, rubella (MMR) vaccine.** / You need at least 1 dose of MMR if you were born in 1957 or later. You may also need a 2nd dose. For females of childbearing age, rubella immunity should be determined. If there is no evidence of immunity, females who are not pregnant should be vaccinated. If there is no evidence of immunity, females who are pregnant should delay immunization until after pregnancy.  Pneumococcal 13-valent conjugate (PCV13) vaccine.** / Consult your health care provider.  Pneumococcal polysaccharide (PPSV23) vaccine.** / 1 to 2 doses if  you smoke cigarettes or if you have certain conditions.  Meningococcal vaccine.** / Consult your health care provider.  Hepatitis A vaccine.** / Consult your health care provider.  Hepatitis B vaccine.** / Consult your health care provider.  Haemophilus influenzae type b (Hib) vaccine.** / Consult your health care provider.  Ages 65 years and over  Blood pressure check.** / Every 1 to 2 years.  Lipid and cholesterol check.** / Every 5 years beginning at age 20 years.  Lung cancer screening. / Every year if you are aged 55 80 years and have a 30-pack-year history of smoking and currently smoke or have quit within the past 15 years. Yearly screening is stopped once you have quit smoking for at least 15 years or develop a health problem that   would prevent you from having lung cancer treatment.  Clinical breast exam.** / Every year after age 103 years.  BRCA-related cancer risk assessment.** / For women who have family members with a BRCA-related cancer (breast, ovarian, tubal, or peritoneal cancers).  Mammogram.** / Every year beginning at age 36 years and continuing for as long as you are in good health. Consult with your health care provider.  Pap test.** / Every 3 years starting at age 5 years through age 85 or 10 years with 3 consecutive normal Pap tests. Testing can be stopped between 65 and 70 years with 3 consecutive normal Pap tests and no abnormal Pap or HPV tests in the past 10 years.  HPV screening.** / Every 3 years from ages 93 years through ages 70 or 45 years with a history of 3 consecutive normal Pap tests. Testing can be stopped between 65 and 70 years with 3 consecutive normal Pap tests and no abnormal Pap or HPV tests in the past 10 years.  Fecal occult blood test (FOBT) of stool. / Every year beginning at age 8 years and continuing until age 45 years. You may not need to do this test if you get a colonoscopy every 10 years.  Flexible sigmoidoscopy or colonoscopy.** /  Every 5 years for a flexible sigmoidoscopy or every 10 years for a colonoscopy beginning at age 69 years and continuing until age 68 years.  Hepatitis C blood test.** / For all people born from [birth year] through 1965 and any individual with known risks for hepatitis C.  Osteoporosis screening.** / A one-time screening for women ages 7 years and over and women at risk for fractures or osteoporosis.  Skin self-exam. / Monthly.  Influenza vaccine. / Every year.  Tetanus, diphtheria, and acellular pertussis (Tdap/Td) vaccine.** / 1 dose of Td every 10 years.  Varicella vaccine.** / Consult your health care provider.  Zoster vaccine.** / 1 dose for adults aged 5 years or older.  Pneumococcal 13-valent conjugate (PCV13) vaccine.** / Consult your health care provider.  Pneumococcal polysaccharide (PPSV23) vaccine.** / 1 dose for all adults aged 74 years and older.  Meningococcal vaccine.** / Consult your health care provider.  Hepatitis A vaccine.** / Consult your health care provider.  Hepatitis B vaccine.** / Consult your health care provider.  Haemophilus influenzae type b (Hib) vaccine.** / Consult your health care provider. ** Family history and personal history of risk and conditions may change your health care provider's recommendations. Document Released: 04/28/2001 Document Revised: 12/21/2012  [facility] Patient Information 2014 [place], [place].   EXERCISE AND DIET:  We recommended that you start or continue a regular exercise program for good health. Regular exercise means any activity that makes your heart beat faster and makes you sweat.  We recommend exercising at least 30 minutes per day at least 3 days a week, preferably 5.  We also recommend a diet low in fat and sugar / carbohydrates.  Inactivity, poor dietary choices and obesity can cause diabetes, heart attack, stroke, and kidney damage, among others.     ALCOHOL AND SMOKING:  Women should limit their alcohol intake to no  more than 7 drinks/beers/glasses of wine (combined, not each!) per week. Moderation of alcohol intake to this level decreases your risk of breast cancer and liver damage.  ( And of course, no recreational drugs are part of a healthy lifestyle.)  Also, you should not be smoking at all or even being exposed to second hand smoke. Most people know smoking can  cause cancer, and various heart and lung diseases, but did you know it also contributes to weakening of your bones?  Aging of your skin?  Yellowing of your teeth and nails?   CALCIUM AND VITAMIN D:  Adequate intake of calcium and Vitamin D are recommended.  The recommendations for exact amounts of these supplements seem to change often, but generally speaking 600 mg of calcium (either carbonate or citrate) and 800 units of Vitamin D per day seems prudent. Certain women may benefit from higher intake of Vitamin D.  If you are among these women, your doctor will have told you during your visit.     PAP SMEARS:  Pap smears, to check for cervical cancer or precancers,  have traditionally been done yearly, although recent scientific advances have shown that most women can have pap smears less often.  However, every woman still should have a physical exam from her gynecologist or primary care physician every year. It will include a breast check, inspection of the vulva and vagina to check for abnormal growths or skin changes, a visual exam of the cervix, and then an exam to evaluate the size and shape of the uterus and ovaries.  And after 43 years of age, a rectal exam is indicated to check for rectal cancers. We will also provide age appropriate advice regarding health maintenance, like when you should have certain vaccines, screening for sexually transmitted diseases, bone density testing, colonoscopy, mammograms, etc.    MAMMOGRAMS:  All women over 55 years old should have a yearly mammogram. Many facilities now offer a "3D" mammogram, which may cost  around $50 extra out of pocket. If possible,  we recommend you accept the option to have the 3D mammogram performed.  It both reduces the number of women who will be called back for extra views which then turn out to be normal, and it is better than the routine mammogram at detecting truly abnormal areas.     COLONOSCOPY:  Colonoscopy to screen for colon cancer is recommended for all women at age 29.  We know, you hate the idea of the prep.  We agree, BUT, having colon cancer and not knowing it is worse!!  Colon cancer so often starts as a polyp that can be seen and removed at colonscopy, which can quite literally save your life!  And if your first colonoscopy is normal and you have no family history of colon cancer, most women don't have to have it again for 10 years.  Once every ten years, you can do something that may end up saving your life, right?  We will be happy to help you get it scheduled when you are ready.  Be sure to check your insurance coverage so you understand how much it will cost.  It may be covered as a preventative service at no cost, but you should check your particular policy.   Remain well hydrated. To reduce Triglycerides and LDL (bad) cholesterol- follow Mediterranean diet and increase regular exercise.  Recommend at least 30 minutes daily, 5 days per week of walking, jogging, biking, swimming, YouTube/Pinterest workout videos. Continue all medications as directed. Continue follow-up with OB/GYN and Neurology as directed. Please schedule fasting lab appt in 4 months, to re-check cholesterol panel. Recommend annual physical with fasting labs in 1 year. Continue to social distance and wear a mask when in public. GREAT TO SEE YOU!

## 2019-01-23 NOTE — Assessment & Plan Note (Signed)
Currently smoking 6-7 cigarettes She has tried Nicoderm patch in the patch- declined re-starting today.

## 2019-01-23 NOTE — Assessment & Plan Note (Signed)
The 10-year ASCVD risk score Mikey Bussing DC Brooke Bonito., et al., 2013) is: 3.1%   Values used to calculate the score:     Age: 43 years     Sex: Female     Is Non-Hispanic African American: No     Diabetic: No     Tobacco smoker: Yes     Systolic Blood Pressure: [id number] mmHg     Is BP treated: No     HDL Cholesterol: 37 mg/dL     Total Cholesterol: 179 mg/dL LDL-110 Encouraged lifestyle Re-check Lipids in 4 months

## 2019-01-24 ENCOUNTER — Encounter: Payer: Self-pay | Admitting: Adult Health

## 2019-01-25 NOTE — Progress Notes (Signed)
PATIENT: Janice Silva DOB: [birth year]  REASON FOR VISIT: follow up HISTORY FROM: patient  HISTORY OF PRESENT ILLNESS: Today 01/26/19  Janice Silva is a 43 year old female with history of fullness of the head, she has a form of a tension headache.  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  She has complained of a lightheaded, floaty sensation in her head.  After last visit, she felt that nortriptyline may be wearing off.  Her dose was increased to 40 mg at bedtime.  She was given a referral to ENT.  She indicates she no longer has a sensation of fullness in her head.  She may on occasion have a floating sensation if she moves too fast, a few times a month.  The sensation is very random, has not been significant.  She did not follow-up with ENT, does not feel this is further warranted at this time.  She is tolerating the increased dose of nortriptyline. Her heart rate was increased today, 109, but was normal, 92 a few days ago at her primary doctor.  She indicates she sleeps well.  She works full-time and operates a Teacher, music without difficulty.  She presents today for follow-up unaccompanied.  HISTORY 06/29/2018 SS: [name] is a 43 year old female with history of symptoms of fullness of the head, patient had a form of a tension headache.  She was started on nortriptyline in January 2020.  She had MRI of the brain in January 2020, was unremarkable, and incidental pineal cyst.  She is currently taking 30 mg of nortriptyline at bedtime.  She reports that the medication has helped her head sensation.  She reports that over the last few weeks, she thinks the medication may be wearing off, feeling the sensation of lightheadedness, dizziness inside her head.  She denies any nausea or vomiting.  She reports the head pressure has improved.  She noticed this mostly when she is walking, will feel like she is swaying.  She reports this will  happen every few days.  Initially the nortriptyline was beneficial, recently the symptoms have started to return.  On the day she feels a sensation, it will happen on and off throughout the day.  She denies any numbness or weakness, problems with her vision. She is tolerating the medication well.  REVIEW OF SYSTEMS: Out of a complete 14 system review of symptoms, the patient complains only of the following symptoms, and all other reviewed systems are negative.  Headache  ALLERGIES: No Known Allergies  HOME MEDICATIONS: Outpatient Medications Prior to Visit  Medication Sig Dispense Refill  . ferrous sulfate 325 (65 FE) MG tablet Take 325 mg by mouth daily with breakfast.    . norethindrone-ethinyl estradiol (OVCON-35, 28,) 0.4-35 MG-MCG tablet Ovcon-35 (28)    . nortriptyline (PAMELOR) 10 MG capsule Take 4 tablets at bedtime (40 mg) 120 capsule 4  . vitamin B-12 (CYANOCOBALAMIN) 500 MCG tablet Take 500 mcg by mouth daily.     No facility-administered medications prior to visit.     PAST MEDICAL HISTORY: Past Medical History:  Diagnosis Date  . Dizziness   . Elevated WBC count   . Hyperventilation   . Migraine   . Nausea   . Numbness of feet   . Numbness of hand   . Palpitation   . Racing heart beat   . 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 MYRINGOTOMY WITH TUBE PLACEMENT    . TONSILLECTOMY AND ADENOIDECTOMY      FAMILY HISTORY: Family History  Problem Relation Age of Onset  . Lupus Mother   . Healthy Father   . Healthy Sister   . Healthy Daughter   . Healthy Daughter   . Breast cancer Neg Hx     SOCIAL HISTORY: Social History   Socioeconomic History  . Marital status: Married    Spouse name: Not on file  . Number of children: 2  . Years of education: Not on file  . Highest education level: Some college, no degree  Occupational History  . Occupation: [employer] (paper)  Social Needs   . Financial resource strain: Not on file  . Food insecurity    Worry: Not on file    Inability: Not on file  . Transportation needs    Medical: Not on file    Non-medical: Not on file  Tobacco Use  . Smoking status: Current Some Day Smoker    Packs/day: 0.25    Years: 25.00    Pack years: 6.25    Types: Cigarettes  . Smokeless tobacco: Never Used  Substance and Sexual Activity  . Alcohol use: Yes    Alcohol/week: 0.0 standard drinks    Comment: social  . Drug use: No  . Sexual activity: Yes    Birth control/protection: Pill  Lifestyle  . Physical activity    Days per week: Not on file    Minutes per session: Not on file  . Stress: Not on file  Relationships  . Social Herbalist on phone: Not on file    Gets together: Not on file    Attends religious service: Not on file    Active member of club or organization: Not on file    Attends meetings of clubs or organizations: Not on file    Relationship status: Not on file  . Intimate partner violence    Fear of current or ex partner: Not on file    Emotionally abused: Not on file    Physically abused: Not on file    Forced sexual activity: Not on file  Other Topics Concern  . Not on file  Social History Narrative   Right handed    Lives at home with husband and 2 daughters    1 cup of caffeine daily     PHYSICAL EXAM  Vitals:   01/26/19 0746  BP: 105/76  Pulse: (!) 109  Temp: 97.6 F (36.4 C)  Weight: 168 lb (76.2 kg)  Height: 5' 5.5" (1.664 m)   Body mass index is 27.53 kg/m.  Generalized: Well developed, in no acute distress   Neurological examination  Mentation: Alert oriented to time, place, history taking. Follows all commands speech and language fluent Cranial nerve II-XII: Pupils were equal round reactive to light. Extraocular movements were full, visual field were full on confrontational test. Facial sensation and strength were normal. Head turning and shoulder shrug were normal and  symmetric. Motor: The motor testing reveals 5 over 5 strength of all 4 extremities. Good symmetric motor tone is noted throughout.  Sensory: Sensory testing is intact to soft touch on all 4 extremities. No evidence of extinction is noted.  Coordination: Cerebellar testing reveals good finger-nose-finger and heel-to-shin bilaterally.  Gait and station: Gait is normal. Tandem gait is normal. Romberg is negative. No drift is seen.  Reflexes: Deep tendon reflexes are symmetric and normal bilaterally.  DIAGNOSTIC DATA (LABS, IMAGING, TESTING) - I reviewed patient records, labs, notes, testing and imaging myself where available.  Lab Results  Component Value Date   WBC 8.4 01/20/2019   HGB 13.6 01/20/2019   HCT 40.5 01/20/2019   MCV 89 01/20/2019   PLT 403 01/20/2019      Component Value Date/Time   NA 137 01/20/2019 0846   K 4.8 01/20/2019 0846   CL 99 01/20/2019 0846   CO2 24 01/20/2019 0846   GLUCOSE 92 01/20/2019 0846   GLUCOSE 78 06/10/2015 1144   BUN 8 01/20/2019 0846   CREATININE 0.98 01/20/2019 0846   CREATININE 0.78 06/10/2015 1144   CALCIUM 9.5 01/20/2019 0846   PROT 6.8 01/20/2019 0846   ALBUMIN 4.2 01/20/2019 0846   AST 16 01/20/2019 0846   ALT 14 01/20/2019 0846   ALKPHOS 98 01/20/2019 0846   BILITOT 0.2 01/20/2019 0846   GFRNONAA 71 01/20/2019 0846   GFRAA 82 01/20/2019 0846   Lab Results  Component Value Date   CHOL 179 01/20/2019   HDL 37 (L) 01/20/2019   LDLCALC 110 (H) 01/20/2019   TRIG 184 (H) 01/20/2019   CHOLHDL 4.8 (H) 01/20/2019   Lab Results  Component Value Date   HGBA1C 5.3 01/20/2019   No results found for: VITAMINB12 Lab Results  Component Value Date   TSH 1.120 01/20/2019    ASSESSMENT AND PLAN 43 y.o. 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 here with:  1.  Chronic  fullness sensation in the head, lightheaded sensations  Overall, she is doing quite well.  She no longer has a sensation of fullness in her head.  The lightheaded sensations have improved, are random, and not significant. She feels the nortriptyline has worked well for her, and wishes to stay on the current dose.  She will remain on nortriptyline 40 mg at bedtime.  Her heart rate was noted to be elevated today at 109, but was normal at 92, a few days ago at her primary doctor.  She will monitor this at home, elevated heart rate could be attributed to the higher dose of nortriptyline, but she feels is related to wearing a mask.  She will follow-up in 6 months or sooner if needed.  I did advise if her symptoms worsen or she develops any new symptoms she should let us know.  MRI of the brain in January 2020, was unremarkable with the exception of an incidental pineal cyst, no compression was seen.  I spent 15 minutes with the patient. 50% of this time was spent discussing her plan of care  Evangeline Dakin, DNP 01/26/2019, 7:53 AM [facility] [address], [place], [place] [address] [phone]

## 2019-01-26 ENCOUNTER — Ambulatory Visit: Payer: BC Managed Care – PPO | Admitting: Neurology

## 2019-01-26 ENCOUNTER — Encounter: Payer: Self-pay | Admitting: Neurology

## 2019-01-26 ENCOUNTER — Other Ambulatory Visit: Payer: Self-pay

## 2019-01-26 VITALS — BP 105/76 | HR 109 | Temp 97.6°F | Ht 65.5 in | Wt 168.0 lb

## 2019-01-26 DIAGNOSIS — H814 Vertigo of central origin: Secondary | ICD-10-CM

## 2019-01-26 MED ORDER — NORTRIPTYLINE HCL 10 MG PO CAPS: ORAL_CAPSULE | ORAL | 6 refills | Status: DC

## 2019-01-26 NOTE — Progress Notes (Signed)
I have read the note, and I agree with the clinical assessment and plan.  Taquan Bralley K Fread Kottke   

## 2019-01-26 NOTE — Patient Instructions (Signed)
Continue nortriptyline 40 mg at bedtime. Do monitor your heart rate at home. Ideally, it should be less than 100.

## 2019-02-28 ENCOUNTER — Encounter: Payer: Self-pay | Admitting: Neurology

## 2019-03-07 DIAGNOSIS — Z01419 Encounter for gynecological examination (general) (routine) without abnormal findings: Secondary | ICD-10-CM | POA: Diagnosis not present

## 2019-03-07 DIAGNOSIS — Z6827 Body mass index (BMI) 27.0-27.9, adult: Secondary | ICD-10-CM | POA: Diagnosis not present

## 2019-03-07 DIAGNOSIS — Z1231 Encounter for screening mammogram for malignant neoplasm of breast: Secondary | ICD-10-CM | POA: Diagnosis not present

## 2019-05-24 ENCOUNTER — Other Ambulatory Visit: Payer: Self-pay | Admitting: Family Medicine

## 2019-05-24 DIAGNOSIS — E7800 Pure hypercholesterolemia, unspecified: Secondary | ICD-10-CM

## 2019-05-24 NOTE — Progress Notes (Signed)
Per Valetta Fuller note on 01/23/19-Please schedule fasting lab appt in 4 months, to re-check cholesterol panel.   Original lipid canceled and reordered under [place] name. AS, CMA

## 2019-05-29 ENCOUNTER — Other Ambulatory Visit: Payer: BC Managed Care – PPO

## 2019-07-26 ENCOUNTER — Other Ambulatory Visit: Payer: Self-pay

## 2019-07-26 ENCOUNTER — Ambulatory Visit (INDEPENDENT_AMBULATORY_CARE_PROVIDER_SITE_OTHER): Payer: BC Managed Care – PPO | Admitting: Neurology

## 2019-07-26 ENCOUNTER — Encounter: Payer: Self-pay | Admitting: Neurology

## 2019-07-26 DIAGNOSIS — G43909 Migraine, unspecified, not intractable, without status migrainosus: Secondary | ICD-10-CM | POA: Diagnosis not present

## 2019-07-26 MED ORDER — NORTRIPTYLINE HCL 10 MG PO CAPS: ORAL_CAPSULE | ORAL | 11 refills | Status: DC

## 2019-07-26 NOTE — Patient Instructions (Signed)
It was great to see you today! Continue nortriptyline, may try to decrease dose slowly as tolerated See you in 8 months

## 2019-07-26 NOTE — Progress Notes (Signed)
PATIENT: Janice Silva DOB: August 31, 1975  REASON FOR VISIT: follow up HISTORY FROM: patient  HISTORY OF PRESENT ILLNESS: Today 07/26/19  [name] is a 44 year old female with history of fullness of the head, she has a form of a tension headache.  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 She has responded well to nortriptyline 40 mg at bedtime. Now rarely has a sensation of head fullness.  Is tolerating nortriptyline well.  She routinely goes to a chiropractor to help her back and sinus issues, really likes this.  She denies any new problems or concerns.  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  I previously referred her for ENT evaluation, this was during [place], she was never contacted, but right now doesn't feel necessary.  She presents today for evaluation unaccompanied.  HISTORY 01/26/2019 SS: [name] is a 44 year old female with history of fullness of the head, she has a form of a tension headache.  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  She has complained of a lightheaded, floaty sensation in her head.  After last visit, she felt that nortriptyline may be wearing off.  Her dose was increased to 40 mg at bedtime.  She was given a referral to ENT.  She indicates she no longer has a sensation of fullness in her head.  She may on occasion have a floating sensation if she moves too fast, a few times a month.  The sensation is very random, has not been significant.  She did not follow-up with ENT, does not feel this is further warranted at this time.  She is tolerating the increased dose of nortriptyline. Her heart rate was increased today, 109, but was normal, 92 a few days ago at her primary doctor.  She indicates she sleeps well.  She works full-time and operates a Teacher, music without difficulty.  She presents today for follow-up  unaccompanied.   REVIEW OF SYSTEMS: Out of a complete 14 system review of symptoms, the patient complains only of the following symptoms, and all other reviewed systems are negative.  headache  ALLERGIES: No Known Allergies  HOME MEDICATIONS: Outpatient Medications Prior to Visit  Medication Sig Dispense Refill  . ferrous sulfate 325 (65 FE) MG tablet Take 325 mg by mouth daily with breakfast.    . norethindrone-ethinyl estradiol (OVCON-35, 28,) 0.4-35 MG-MCG tablet Ovcon-35 (28)    . vitamin B-12 (CYANOCOBALAMIN) 500 MCG tablet Take 500 mcg by mouth daily.    . nortriptyline (PAMELOR) 10 MG capsule Take 4 tablets at bedtime (40 mg) 120 capsule 6   No facility-administered medications prior to visit.    PAST MEDICAL HISTORY: Past Medical History:  Diagnosis Date  . Dizziness   . Elevated WBC count   . Hyperventilation   . Migraine   . Nausea   . Numbness of feet   . Numbness of hand   . Palpitation   . Racing heart beat   . Thrombocytosis ([place])     PAST SURGICAL HISTORY: Past Surgical History:  Procedure Laterality Date  . MYRINGOTOMY WITH TUBE PLACEMENT    . TONSILLECTOMY AND ADENOIDECTOMY      FAMILY HISTORY: Family History  Problem Relation Age of Onset  . Lupus Mother   . Healthy Father   . Healthy Sister   . Healthy Daughter   . Healthy Daughter   .  Breast cancer Neg Hx     SOCIAL HISTORY: Social History   Socioeconomic History  . Marital status: Married    Spouse name: Not on file  . Number of children: 2  . Years of education: Not on file  . Highest education level: Some college, no degree  Occupational History  . Occupation: [employer] (paper)  Tobacco Use  . Smoking status: Current Some Day Smoker    Packs/day: 0.25    Years: 25.00    Pack years: 6.25    Types: Cigarettes  . Smokeless tobacco: Never Used  Substance and Sexual Activity  . Alcohol use: Yes    Alcohol/week: 0.0 standard drinks    Comment: social  . Drug use: No    . Sexual activity: Yes    Birth control/protection: Pill  Other Topics Concern  . Not on file  Social History Narrative   Right handed    Lives at home with husband and 2 daughters    1 cup of caffeine daily    Social Determinants of Health   Financial Resource Strain:   . Difficulty of Paying Living Expenses:   Food Insecurity:   . Worried About Charity fundraiser in the Last Year:   . Arboriculturist in the Last Year:   Transportation Needs:   . Film/video editor (Medical):   Marland Kitchen Lack of Transportation (Non-Medical):   Physical Activity:   . Days of Exercise per Week:   . Minutes of Exercise per Session:   Stress:   . Feeling of Stress :   Social Connections:   . Frequency of Communication with Friends and Family:   . Frequency of Social Gatherings with Friends and Family:   . Attends Religious Services:   . Active Member of Clubs or Organizations:   . Attends Archivist Meetings:   Marland Kitchen Marital Status:   Intimate Partner Violence:   . Fear of Current or Ex-Partner:   . Emotionally Abused:   Marland Kitchen Physically Abused:   . Sexually Abused:    PHYSICAL EXAM  Vitals:   07/26/19 0804  BP: 106/76  Pulse: 100  Temp: (!) 97 F (36.1 C)  Weight: 172 lb (78 kg)  Height: 5\' 6"  (1.676 m)   Body mass index is 27.76 kg/m.  Generalized: Well developed, in no acute distress   Neurological examination  Mentation: Alert oriented to time, place, history taking. Follows all commands speech and language fluent Cranial nerve II-XII: Pupils were equal round reactive to light. Extraocular movements were full, visual field were full on confrontational test. Facial sensation and strength were normal. Head turning and shoulder shrug  were normal and symmetric. Motor: The motor testing reveals 5 over 5 strength of all 4 extremities. Good symmetric motor tone is noted throughout.  Sensory: Sensory testing is intact to soft touch on all 4 extremities. No evidence of extinction is  noted.  Coordination: Cerebellar testing reveals good finger-nose-finger and heel-to-shin bilaterally.  Gait and station: Gait is normal. Tandem gait is normal. Romberg is negative. No drift is seen.  Reflexes: Deep tendon reflexes are symmetric and normal bilaterally.   DIAGNOSTIC DATA (LABS, IMAGING, TESTING) - I reviewed patient records, labs, notes, testing and imaging myself where available.  Lab Results  Component Value Date   WBC 8.4 01/20/2019   HGB 13.6 01/20/2019   HCT 40.5 01/20/2019   MCV 89 01/20/2019   PLT 403 01/20/2019      Component Value Date/Time  NA 137 01/20/2019 0846   K 4.8 01/20/2019 0846   CL 99 01/20/2019 0846   CO2 24 01/20/2019 0846   GLUCOSE 92 01/20/2019 0846   GLUCOSE 78 06/10/2015 1144   BUN 8 01/20/2019 0846   CREATININE 0.98 01/20/2019 0846   CREATININE 0.78 06/10/2015 1144   CALCIUM 9.5 01/20/2019 0846   PROT 6.8 01/20/2019 0846   ALBUMIN 4.2 01/20/2019 0846   AST 16 01/20/2019 0846   ALT 14 01/20/2019 0846   ALKPHOS 98 01/20/2019 0846   BILITOT 0.2 01/20/2019 0846   GFRNONAA 71 01/20/2019 0846   GFRAA 82 01/20/2019 0846   Lab Results  Component Value Date   CHOL 179 01/20/2019   HDL 37 (L) 01/20/2019   LDLCALC 110 (H) 01/20/2019   TRIG 184 (H) 01/20/2019   CHOLHDL 4.8 (H) 01/20/2019   Lab Results  Component Value Date   HGBA1C 5.3 01/20/2019   No results found for: VITAMINB12 Lab Results  Component Value Date   TSH 1.120 01/20/2019    ASSESSMENT AND PLAN 44 y.o. year old female  has a past medical history of Dizziness, Elevated WBC count, Hyperventilation, Migraine, Nausea, Numbness of feet, Numbness of hand, Palpitation, Racing heart beat, and Thrombocytosis ([place]). here with:  1.  Chronic fullness sensation in the head, lightheaded sensations  Overall, she continues to do quite well.  She will try to reduce her dose of nortriptyline to 30 mg at bedtime.  If she continues to do well, she may slowly continue to decrease  the dose.  She will follow-up with his office in 8 months or sooner if needed.  I spent 20 minutes of face-to-face and non-face-to-face time with patient.  This included previsit chart review, lab review, study review, order entry, electronic health record documentation, patient education.  Butler Denmark, AGNP-C, DNP 07/26/2019, 8:23 AM [facility] [address] [place], [place] [address] [phone]

## 2019-07-26 NOTE — Progress Notes (Signed)
I have read the note, and I agree with the clinical assessment and plan.  Arneisha Kincannon K Tyvion Edmondson   

## 2019-09-15 ENCOUNTER — Telehealth: Payer: BC Managed Care – PPO | Admitting: Family

## 2019-09-15 DIAGNOSIS — J069 Acute upper respiratory infection, unspecified: Secondary | ICD-10-CM | POA: Diagnosis not present

## 2019-09-15 MED ORDER — FLUTICASONE PROPIONATE 50 MCG/ACT NA SUSP: 2.0000 | Freq: Every day | NASAL | 6 refills | Status: DC

## 2019-09-15 NOTE — Progress Notes (Signed)

## 2019-12-07 ENCOUNTER — Encounter (INDEPENDENT_AMBULATORY_CARE_PROVIDER_SITE_OTHER): Payer: Self-pay | Admitting: Adult Health

## 2020-03-05 ENCOUNTER — Encounter: Payer: Self-pay | Admitting: Physician Assistant

## 2020-03-05 ENCOUNTER — Ambulatory Visit (INDEPENDENT_AMBULATORY_CARE_PROVIDER_SITE_OTHER): Payer: BC Managed Care – PPO | Admitting: Physician Assistant

## 2020-03-05 ENCOUNTER — Other Ambulatory Visit: Payer: Self-pay

## 2020-03-05 VITALS — BP 111/80 | HR 120 | Ht 66.0 in | Wt 176.1 lb

## 2020-03-05 DIAGNOSIS — Z0000 Encounter for general adult medical examination without abnormal findings: Secondary | ICD-10-CM

## 2020-03-05 DIAGNOSIS — E7800 Pure hypercholesterolemia, unspecified: Secondary | ICD-10-CM

## 2020-03-05 NOTE — Patient Instructions (Signed)

## 2020-03-05 NOTE — Progress Notes (Signed)
Female Physical   Impression and Recommendations:    1. Healthcare maintenance   2. Elevated LDL cholesterol level      1) Anticipatory Guidance: Skin CA prevention-recommend to use sunscreen when outside along with skin surveillance; eating a balanced and modest diet; physical activity at least 25 minutes per day or minimum of 150 min/ week moderate to intense activity.  2) Immunizations / Screenings / Labs:   All immunizations are up-to-date per recommendations or will be updated today if pt allows.    - Patient understands with dental and vision screens they will schedule independently.  - Will obtain CBC, CMP, HgA1c, Lipid panel, TSH and vit D when fasting. -Declined hep C screening, Tdap and influenza vaccines. -Patient will schedule mammogram with OB/GYN. -UTD on Pap smear and HIV screening.  3) Weight:  Recommend to continue to improve diet habits to improve overall feelings of well being and objective health data. Improve nutrient density of diet through increasing intake of fruits and vegetables and decreasing saturated fats, white flour products and refined sugars. -Patient has decreased snacks.  4) Healthcare Maintenance:  -Continue to follow-up with neurology and OB/GYN. -Continue current medication regimen. -Encouraged to continue with reducing tobacco use and eventually quit. -Recommend to follow a heart healthy diet and continue to stay active. -Continue to stay well-hydrated. -Schedule lab visit for fasting blood work. -Follow-up in 1 year for CPE and FBW or sooner if needed.   No orders of the defined types were placed in this encounter.   No orders of the defined types were placed in this encounter.    Return in about 1 year (around 03/05/2021) for CPE and FBW; lab visit for FBW in 1-4 weeks.    Gross side effects, risk and benefits, and alternatives of medications discussed with patient.  Patient is aware that all medications have potential side  effects and we are unable to predict every side effect or drug-drug interaction that may occur.  Expresses verbal understanding and consents to current therapy plan and treatment regimen.  F-up preventative CPE in 1 year- this is in addition to any chronic care visits.    Please see orders placed and AVS handed out to patient at the end of our visit for further patient instructions/ counseling done pertaining to today's office visit.   Note:  This note was prepared with assistance of Dragon voice recognition software. Occasional wrong-word or sound-a-like substitutions may have occurred due to the inherent limitations of voice recognition software.    Subjective:     CPE HPI: Kolie Rissman is a 44 y.o. female who presents to [place] [facility] today a yearly health maintenance exam.   Health Maintenance Summary  - Reviewed and updated, unless pt declines services.  Last Cologuard or Colonoscopy:    N/A Family history of Colon CA: N  Tobacco History Reviewed:  Y, current smoker, 1 cigarette after a meal, trying to quit Alcohol and/or drug use:    No concerns; no use Exercise Habits:  Elliptical 2-3 times/wk Dental Home:Y Eye exams:Y Female Health:  PAP Smear - last known results:  02/15/2018- normal, followed by Ob-Gyn STD concerns:   none, Birth control method: OCP Lumps or breast concerns:  none Breast Cancer Family History:   No   Additional concerns beyond health maintenance issues: none    Immunization History  Administered Date(s) Administered  . Influenza-Unspecified 12/15/2015, 12/23/2015, 01/13/2017, 12/14/2017, 12/29/2018     Health Maintenance  Topic Date Due  .  Hepatitis C Screening  Never done  . COVID-19 Vaccine (1) Never done  . TETANUS/TDAP  Never done  . INFLUENZA VACCINE  10/15/2019  . PAP SMEAR-Modifier  02/15/2021  . HIV Screening  Completed     Wt Readings from Last 3 Encounters:  03/05/20 176 lb 1.6 oz (79.9 kg)   07/26/19 172 lb (78 kg)  01/26/19 168 lb (76.2 kg)   BP Readings from Last 3 Encounters:  03/05/20 111/80  07/26/19 106/76  01/26/19 105/76   Pulse Readings from Last 3 Encounters:  03/05/20 (!) 120  07/26/19 100  01/26/19 (!) 109     Past Medical History:  Diagnosis Date  . Dizziness   . Elevated WBC count   . Hyperventilation   . Migraine   . Nausea   . Numbness of feet   . Numbness of hand   . Palpitation   . Racing heart beat   . Thrombocytosis       Past Surgical History:  Procedure Laterality Date  . MYRINGOTOMY WITH TUBE PLACEMENT    . TONSILLECTOMY AND ADENOIDECTOMY        Family History  Problem Relation Age of Onset  . Lupus Mother   . Healthy Father   . Healthy Sister   . Healthy Daughter   . Healthy Daughter   . Breast cancer Neg Hx       Social History   Substance and Sexual Activity  Drug Use No  ,   Social History   Substance and Sexual Activity  Alcohol Use Yes  . Alcohol/week: 0.0 standard drinks   Comment: social  ,   Social History   Tobacco Use  Smoking Status Current Some Day Smoker  . Packs/day: 0.25  . Years: 25.00  . Pack years: 6.25  . Types: Cigarettes  Smokeless Tobacco Never Used  ,   Social History   Substance and Sexual Activity  Sexual Activity Yes  . Birth control/protection: Pill    Current Outpatient Medications on File Prior to Visit  Medication Sig Dispense Refill  . ferrous sulfate 325 (65 FE) MG tablet Take 325 mg by mouth daily with breakfast.    . fluticasone (FLONASE) 50 MCG/ACT nasal spray Place 2 sprays into both nostrils daily. 16 g 6  . norethindrone-ethinyl estradiol (OVCON-35) 0.4-35 MG-MCG tablet Ovcon-35 (28)    . nortriptyline (PAMELOR) 10 MG capsule Take 4 tablets at bedtime (40 mg) 120 capsule 11  . vitamin B-12 (CYANOCOBALAMIN) 500 MCG tablet Take 500 mcg by mouth daily.     No current facility-administered medications on file prior to visit.    Allergies: Patient has  no known allergies.  Review of Systems: General:   Denies fever, chills, unexplained weight loss.  Optho/Auditory:   Denies visual changes, blurred vision/LOV Respiratory:   Denies SOB, DOE more than baseline levels.   Cardiovascular:   Denies chest pain, palpitations, new onset peripheral edema  Gastrointestinal:   Denies nausea, vomiting, diarrhea.  Genitourinary: Denies dysuria, freq/ urgency, flank pain or discharge from genitals.  Endocrine:     Denies hot or cold intolerance, polyuria, polydipsia. Musculoskeletal:   Denies unexplained myalgias, joint swelling, unexplained arthralgias, gait problems.  Skin:  Denies rash, suspicious lesions Neurological:     Denies dizziness, unexplained weakness, numbness  Psychiatric/Behavioral:   Denies mood changes, suicidal or homicidal ideations, hallucinations    Objective:    Blood pressure 111/80, pulse (!) 120, height 5\' 6"  (1.676 m), weight 176 lb 1.6 oz (79.9 kg),  SpO2 99 %. Body mass index is 28.42 kg/m. General Appearance:    Alert, cooperative, no distress, appears stated age  Head:    Normocephalic, without obvious abnormality, atraumatic  Eyes:    PERRL, conjunctiva/corneas clear, EOM's intact, both eyes  Ears:    Normal TM's and external ear canals, left ear; partial cerumen impaction of right ear  Nose:   Nares normal, septum midline, mucosa normal, no drainage    or sinus tenderness  Throat:   Lips w/o lesion, mucosa moist, and tongue normal; teeth and   gums normal  Neck:   Supple, symmetrical, trachea midline, no adenopathy;    thyroid:  no enlargement/tenderness/nodules; no JVD  Back:     Symmetric, no curvature, ROM normal, no CVA tenderness  Lungs:     Clear to auscultation bilaterally, respirations unlabored, no       Wh/ R/ R  Chest Wall:    No tenderness or gross deformity; normal excursion   Heart:    Regular rate and rhythm, S1 and S2 normal, no murmur, rub   or gallop  Breast Exam:    Deferred to Ob-Gyn   Abdomen:     Soft, non-tender, bowel sounds active all four quadrants, No   G/R/R, no masses, no organomegaly  Genitalia:   Deferred to Ob-Gyn  Rectal:   Deferred to Ob-Gyn  Extremities:   Extremities normal, atraumatic, no cyanosis or gross edema  Pulses:   2+ and symmetric all extremities  Skin:   Warm, dry, Skin color, texture, turgor normal, no obvious rashes or lesions Psych: No HI/SI, judgement and insight good, Euthymic mood. Full Affect.  Neurologic:   CNII-XII grossly intact, normal strength, sensation and reflexes throughout

## 2020-03-06 ENCOUNTER — Other Ambulatory Visit: Payer: BC Managed Care – PPO

## 2020-03-06 ENCOUNTER — Other Ambulatory Visit: Payer: Self-pay

## 2020-03-06 DIAGNOSIS — D75839 Thrombocytosis, unspecified: Secondary | ICD-10-CM

## 2020-03-06 DIAGNOSIS — E7800 Pure hypercholesterolemia, unspecified: Secondary | ICD-10-CM

## 2020-03-06 DIAGNOSIS — Z0000 Encounter for general adult medical examination without abnormal findings: Secondary | ICD-10-CM

## 2020-03-07 LAB — CBC WITH DIFFERENTIAL/PLATELET
Basophils Absolute: 0.1 10*3/uL (ref 0.0–0.2)
Basos: 1 %
EOS (ABSOLUTE): 0.2 10*3/uL (ref 0.0–0.4)
Eos: 2 %
Hematocrit: 45.3 % (ref 34.0–46.6)
Hemoglobin: 15.1 g/dL (ref 11.1–15.9)
Immature Grans (Abs): 0 10*3/uL (ref 0.0–0.1)
Immature Granulocytes: 0 %
Lymphocytes Absolute: 2.9 10*3/uL (ref 0.7–3.1)
Lymphs: 28 %
MCH: 29.5 pg (ref 26.6–33.0)
MCHC: 33.3 g/dL (ref 31.5–35.7)
MCV: 89 fL (ref 79–97)
Monocytes Absolute: 0.6 10*3/uL (ref 0.1–0.9)
Monocytes: 6 %
Neutrophils Absolute: 6.4 10*3/uL (ref 1.4–7.0)
Neutrophils: 63 %
Platelets: 476 10*3/uL — ABNORMAL HIGH (ref 150–450)
RBC: 5.11 x10E6/uL (ref 3.77–5.28)
RDW: 12.3 % (ref 11.7–15.4)
WBC: 10.3 10*3/uL (ref 3.4–10.8)

## 2020-03-07 LAB — LIPID PANEL
Chol/HDL Ratio: 5 ratio — ABNORMAL HIGH (ref 0.0–4.4)
Cholesterol, Total: 220 mg/dL — ABNORMAL HIGH (ref 100–199)
HDL: 44 mg/dL (ref >39)
LDL Chol Calc (NIH): 149 mg/dL — ABNORMAL HIGH (ref 0–99)
Triglycerides: 151 mg/dL — ABNORMAL HIGH (ref 0–149)
VLDL Cholesterol Cal: 27 mg/dL (ref 5–40)

## 2020-03-07 LAB — COMPREHENSIVE METABOLIC PANEL
ALT: 13 IU/L (ref 0–32)
AST: 17 IU/L (ref 0–40)
Albumin/Globulin Ratio: 1.2 (ref 1.2–2.2)
Albumin: 4.2 g/dL (ref 3.8–4.8)
Alkaline Phosphatase: 120 IU/L (ref 44–121)
BUN/Creatinine Ratio: 8 — ABNORMAL LOW (ref 9–23)
BUN: 9 mg/dL (ref 6–24)
Bilirubin Total: 0.5 mg/dL (ref 0.0–1.2)
CO2: 23 mmol/L (ref 20–29)
Calcium: 10.2 mg/dL (ref 8.7–10.2)
Chloride: 102 mmol/L (ref 96–106)
Creatinine, Ser: 1.2 mg/dL — ABNORMAL HIGH (ref 0.57–1.00)
GFR calc Af Amer: 64 mL/min/{1.73_m2} (ref >59)
GFR calc non Af Amer: 55 mL/min/{1.73_m2} — ABNORMAL LOW (ref >59)
Globulin, Total: 3.4 g/dL (ref 1.5–4.5)
Glucose: 83 mg/dL (ref 65–99)
Potassium: 5.5 mmol/L — ABNORMAL HIGH (ref 3.5–5.2)
Sodium: 138 mmol/L (ref 134–144)
Total Protein: 7.6 g/dL (ref 6.0–8.5)

## 2020-03-07 LAB — TSH: TSH: 1.14 u[IU]/mL (ref 0.450–4.500)

## 2020-03-07 LAB — HEMOGLOBIN A1C
Est. average glucose Bld gHb Est-mCnc: 103 mg/dL
Hgb A1c MFr Bld: 5.2 % (ref 4.8–5.6)

## 2020-03-21 DIAGNOSIS — Z6829 Body mass index (BMI) 29.0-29.9, adult: Secondary | ICD-10-CM | POA: Diagnosis not present

## 2020-03-21 DIAGNOSIS — Z01419 Encounter for gynecological examination (general) (routine) without abnormal findings: Secondary | ICD-10-CM | POA: Diagnosis not present

## 2020-03-25 DIAGNOSIS — Z1231 Encounter for screening mammogram for malignant neoplasm of breast: Secondary | ICD-10-CM | POA: Diagnosis not present

## 2020-03-26 NOTE — Progress Notes (Signed)
PATIENT: Janice Silva DOB: 04-24-1975  REASON FOR VISIT: follow up HISTORY FROM: patient  HISTORY OF PRESENT ILLNESS: Today 03/27/20  Janice Silva is a 45 year old female with history of fullness of the head, form of tension headache.  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  She has had good benefit with nortriptyline, at last visit, she reduced her dose to 30 mg at bedtime.  Continues to do well on lower dose.  No recurrent headache or sensation of fullness.  She seems to tolerate the nortriptyline well.  No adverse effects.  She continues to work full-time.  No changes to her overall health.  Presents today for evaluation unaccompanied.  HISTORY 07/26/2019 SS: Janice Silva is a 45 year old female with history of fullness of the head, she has a form of a tension headache.  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 She has responded well to nortriptyline 40 mg at bedtime. Now rarely has a sensation of head fullness.  Is tolerating nortriptyline well.  She routinely goes to a chiropractor to help her back and sinus issues, really likes this.  She denies any new problems or concerns.  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  I previously referred her for ENT evaluation, this was during [place], she was never contacted, but right now doesn't feel necessary.  She presents today for evaluation unaccompanied.   REVIEW OF SYSTEMS: Out of a complete 14 system review of symptoms, the patient complains only of the following symptoms, and all other reviewed systems are negative.  n/a  ALLERGIES: No Known Allergies  HOME MEDICATIONS: Outpatient Medications Prior to Visit  Medication Sig Dispense Refill  . ferrous sulfate 325 (65 FE) MG tablet Take 325 mg by mouth daily with breakfast.    . fluticasone (FLONASE) 50 MCG/ACT nasal spray Place 2 sprays into  both nostrils daily. 16 g 6  . norethindrone-ethinyl estradiol (OVCON-35) 0.4-35 MG-MCG tablet Ovcon-35 (28)    . vitamin B-12 (CYANOCOBALAMIN) 500 MCG tablet Take 500 mcg by mouth daily.    . nortriptyline (PAMELOR) 10 MG capsule Take 4 tablets at bedtime (40 mg) (Patient taking differently: Take 3 tablets at bedtime (40 mg)) 120 capsule 11   No facility-administered medications prior to visit.    PAST MEDICAL HISTORY: Past Medical History:  Diagnosis Date  . Dizziness   . Elevated WBC count   . Hyperventilation   . Migraine   . Nausea   . Numbness of feet   . Numbness of hand   . Palpitation   . Racing heart beat   . Thrombocytosis     PAST SURGICAL HISTORY: Past Surgical History:  Procedure Laterality Date  . MYRINGOTOMY WITH TUBE PLACEMENT    . TONSILLECTOMY AND ADENOIDECTOMY      FAMILY HISTORY: Family History  Problem Relation Age of Onset  . Lupus Mother   . Healthy Father   . Healthy Sister   . Healthy Daughter   . Healthy Daughter   . Breast cancer Neg Hx     SOCIAL HISTORY: Social History   Socioeconomic History  . Marital status: Married    Spouse name: Not on file  . Number of children: 2  . Years of education: Not on file  . Highest education level: Some college, no degree  Occupational History  . Occupation: [employer] (paper)  Tobacco Use  . Smoking status: Current Some Day Smoker    Packs/day: 0.25    Years: 25.00    Pack years: 6.25    Types: Cigarettes  . Smokeless tobacco: Never Used  Vaping Use  . Vaping Use: Never used  Substance and Sexual Activity  . Alcohol use: Yes    Alcohol/week: 0.0 standard drinks    Comment: social  . Drug use: No  . Sexual activity: Yes    Birth control/protection: Pill  Other Topics Concern  . Not on file  Social History Narrative   Right handed    Lives at home with husband and 2 daughters    1 cup of caffeine daily    Social Determinants of Health   Financial Resource Strain: Not on  file  Food Insecurity: Not on file  Transportation Needs: Not on file  Physical Activity: Not on file  Stress: Not on file  Social Connections: Not on file  Intimate Partner Violence: Not on file   PHYSICAL EXAM  Vitals:   03/27/20 0748  BP: 106/71  Pulse: 92  Weight: 186 lb (84.4 kg)  Height: 5\' 6"  (1.676 m)   Body mass index is 30.02 kg/m.  Generalized: Well developed, in no acute distress   Neurological examination  Mentation: Alert oriented to time, place, history taking. Follows all commands speech and language fluent Cranial nerve II-XII: Pupils were equal round reactive to light. Extraocular movements were full, visual field were full on confrontational test. Facial sensation and strength were normal. Head turning and shoulder shrug  were normal and symmetric. Motor: The motor testing reveals 5 over 5 strength of all 4 extremities. Good symmetric motor tone is noted throughout.  Sensory: Sensory testing is intact to soft touch on all 4 extremities. No evidence of extinction is noted.  Coordination: Cerebellar testing reveals good finger-nose-finger and heel-to-shin bilaterally.  Gait and station: Gait is normal. Tandem gait is normal. Romberg is negative. No drift is seen.  Reflexes: Deep tendon reflexes are symmetric and normal bilaterally.   DIAGNOSTIC DATA (LABS, IMAGING, TESTING) - I reviewed patient records, labs, notes, testing and imaging myself where available.  Lab Results  Component Value Date   WBC 10.3 03/06/2020   HGB 15.1 03/06/2020   HCT 45.3 03/06/2020   MCV 89 03/06/2020   PLT 476 (H) 03/06/2020      Component Value Date/Time   NA 138 03/06/2020 0836   K 5.5 (H) 03/06/2020 0836   CL 102 03/06/2020 0836   CO2 23 03/06/2020 0836   GLUCOSE 83 03/06/2020 0836   GLUCOSE 78 06/10/2015 1144   BUN 9 03/06/2020 0836   CREATININE 1.20 (H) 03/06/2020 0836   CREATININE 0.78 06/10/2015 1144   CALCIUM 10.2 03/06/2020 0836   PROT 7.6 03/06/2020 0836    ALBUMIN 4.2 03/06/2020 0836   AST 17 03/06/2020 0836   ALT 13 03/06/2020 0836   ALKPHOS 120 03/06/2020 0836   BILITOT 0.5 03/06/2020 0836   GFRNONAA 55 (L) 03/06/2020 0836   GFRAA 64 03/06/2020 0836   Lab Results  Component Value Date   CHOL 220 (H) 03/06/2020   HDL 44 03/06/2020   LDLCALC 149 (H) 03/06/2020   TRIG 151 (H) 03/06/2020   CHOLHDL 5.0 (H) 03/06/2020   Lab Results  Component Value Date   HGBA1C 5.2 03/06/2020   No results found for: [id number] Lab Results  Component Value Date   TSH 1.140 03/06/2020   ASSESSMENT AND PLAN 45 y.o. year old  female  has a past medical history of Dizziness, Elevated WBC count, Hyperventilation, Migraine, Nausea, Numbness of feet, Numbness of hand, Palpitation, Racing heart beat, and Thrombocytosis. here with:  1.  Chronic fullness sensation in head, tension type headache  She continues to do quite well.  She will continue on nortriptyline 30 mg at bedtime.  If she continues to do well over the next several months, she may continue to slowly decrease her dose by 10 mg at a time.  She will follow-up in 1 year or sooner if needed.  I spent 20 minutes of face-to-face and non-face-to-face time with patient.  This included previsit chart review, lab review, study review, order entry, electronic health record documentation, patient education.  Butler Denmark, AGNP-C, DNP 03/27/2020, 8:14 AM [facility] [address] [place], [place] [address] [phone]

## 2020-03-27 ENCOUNTER — Other Ambulatory Visit: Payer: Self-pay

## 2020-03-27 ENCOUNTER — Ambulatory Visit (INDEPENDENT_AMBULATORY_CARE_PROVIDER_SITE_OTHER): Payer: BC Managed Care – PPO | Admitting: Neurology

## 2020-03-27 ENCOUNTER — Encounter: Payer: Self-pay | Admitting: Neurology

## 2020-03-27 DIAGNOSIS — G8929 Other chronic pain: Secondary | ICD-10-CM | POA: Insufficient documentation

## 2020-03-27 DIAGNOSIS — G44229 Chronic tension-type headache, not intractable: Secondary | ICD-10-CM | POA: Diagnosis not present

## 2020-03-27 DIAGNOSIS — R519 Headache, unspecified: Secondary | ICD-10-CM | POA: Insufficient documentation

## 2020-03-27 MED ORDER — NORTRIPTYLINE HCL 10 MG PO CAPS: ORAL_CAPSULE | ORAL | 11 refills | Status: DC

## 2020-03-27 NOTE — Patient Instructions (Signed)
In a few months may try to drop nortriptyline down to 20 mg at bedtime See you back in 1 year

## 2020-03-29 NOTE — Progress Notes (Signed)
I have read the note, and I agree with the clinical assessment and plan.  Janice Silva   

## 2020-05-23 ENCOUNTER — Encounter: Payer: Self-pay | Admitting: Nurse Practitioner

## 2020-05-23 ENCOUNTER — Ambulatory Visit: Payer: BC Managed Care – PPO | Admitting: Nurse Practitioner

## 2020-05-23 ENCOUNTER — Encounter: Payer: Self-pay | Admitting: Physician Assistant

## 2020-05-23 ENCOUNTER — Other Ambulatory Visit: Payer: Self-pay

## 2020-05-23 VITALS — BP 92/63 | HR 87 | Temp 98.0°F | Ht 66.0 in | Wt 163.4 lb

## 2020-05-23 DIAGNOSIS — J869 Pyothorax without fistula: Secondary | ICD-10-CM

## 2020-05-23 MED ORDER — SULFAMETHOXAZOLE-TRIMETHOPRIM 800-160 MG PO TABS: 1.0000 | ORAL_TABLET | Freq: Two times a day (BID) | ORAL | 0 refills | Status: DC

## 2020-05-23 NOTE — Patient Instructions (Signed)

## 2020-05-23 NOTE — Progress Notes (Signed)
Acute Office Visit  Subjective:    Patient ID: Janice Silva, female    DOB: 1975/07/11, 45 y.o.   MRN: [id number]  Chief Complaint  Patient presents with  . Cyst    HPI Patient is in today for evaluation of skin abscess. This is located on right upper chest. She states that she first noted this on Monday. She states that skin in the area started getting tender and was red. The area got more and more tender. Lesion has become larger and much more tender. She states that she has tried to squeeze it and get out fluid, but it just causes the lesion to become larger and hurt more. Clothing and purse strap rub against the lesion also increasing the pain. She denies fever, body aches, or chills. She denies history of skin abscesses.   Past Medical History:  Diagnosis Date  . Dizziness   . Elevated WBC count   . Hyperventilation   . Migraine   . Nausea   . Numbness of feet   . Numbness of hand   . Palpitation   . Racing heart beat   . Thrombocytosis     Past Surgical History:  Procedure Laterality Date  . MYRINGOTOMY WITH TUBE PLACEMENT    . TONSILLECTOMY AND ADENOIDECTOMY      Family History  Problem Relation Age of Onset  . Lupus Mother   . Healthy Father   . Healthy Sister   . Healthy Daughter   . Healthy Daughter   . Breast cancer Neg Hx     Social History   Socioeconomic History  . Marital status: Married    Spouse name: Not on file  . Number of children: 2  . Years of education: Not on file  . Highest education level: Some college, no degree  Occupational History  . Occupation: [employer] (paper)  Tobacco Use  . Smoking status: Current Some Day Smoker    Packs/day: 0.25    Years: 25.00    Pack years: 6.25    Types: Cigarettes  . Smokeless tobacco: Never Used  Vaping Use  . Vaping Use: Never used  Substance and Sexual Activity  . Alcohol use: Yes    Alcohol/week: 0.0 standard drinks    Comment: social  . Drug use: No  . Sexual activity: Yes     Birth control/protection: Pill  Other Topics Concern  . Not on file  Social History Narrative   Right handed    Lives at home with husband and 2 daughters    1 cup of caffeine daily    Social Determinants of Health   Financial Resource Strain: Not on file  Food Insecurity: Not on file  Transportation Needs: Not on file  Physical Activity: Not on file  Stress: Not on file  Social Connections: Not on file  Intimate Partner Violence: Not on file    Outpatient Medications Prior to Visit  Medication Sig Dispense Refill  . ferrous sulfate 325 (65 FE) MG tablet Take 325 mg by mouth daily with breakfast.    . fluticasone (FLONASE) 50 MCG/ACT nasal spray Place 2 sprays into both nostrils daily. 16 g 6  . norethindrone-ethinyl estradiol (OVCON-35) 0.4-35 MG-MCG tablet Ovcon-35 (28)    . nortriptyline (PAMELOR) 10 MG capsule Take 3 capsules at bedtime 90 capsule 11  . vitamin B-12 (CYANOCOBALAMIN) 500 MCG tablet Take 500 mcg by mouth daily.     No facility-administered medications prior to visit.    No Known Allergies  Review of Systems  Constitutional: Negative for appetite change, chills, fatigue and fever.  HENT: Negative for congestion and sinus pain.   Respiratory: Negative.  Negative for cough and wheezing.   Cardiovascular: Negative for chest pain and palpitations.  Gastrointestinal: Negative for constipation.  Musculoskeletal: Negative for back pain and myalgias.  Skin: Negative for rash.       Patient has abscess type lesion to right upper aspect of her chest. Red, tender, and getting larger.   Neurological: Negative for dizziness, weakness and headaches.  Psychiatric/Behavioral: The patient is not nervous/anxious.   All other systems reviewed and are negative.      Objective:    Physical Exam Vitals and nursing note reviewed.  Constitutional:      Appearance: Normal appearance. She is well-developed.  HENT:     Head: Normocephalic.  Eyes:     Pupils: Pupils  are equal, round, and reactive to light.  Cardiovascular:     Rate and Rhythm: Normal rate and regular rhythm.     Heart sounds: Normal heart sounds.  Pulmonary:     Effort: Pulmonary effort is normal.     Breath sounds: Normal breath sounds.  Musculoskeletal:        General: Normal range of motion.     Cervical back: Normal range of motion and neck supple.  Skin:    General: Skin is warm and dry.     Findings: Abscess present.       Neurological:     General: No focal deficit present.     Mental Status: She is alert and oriented to person, place, and time.     Today's Vitals   05/23/20 1534  BP: 92/63  Pulse: 87  Temp: 98 F (36.7 C)  SpO2: 96%  Weight: 163 lb 6.4 oz (74.1 kg)   Body mass index is 26.37 kg/m.    Wt Readings from Last 3 Encounters:  05/23/20 163 lb 6.4 oz (74.1 kg)  03/27/20 186 lb (84.4 kg)  03/05/20 176 lb 1.6 oz (79.9 kg)    Health Maintenance Due  Topic Date Due  . Hepatitis C Screening  Never done  . COVID-19 Vaccine (1) Never done  . TETANUS/TDAP  Never done  . INFLUENZA VACCINE  10/15/2019    There are no preventive care reminders to display for this patient.   Lab Results  Component Value Date   TSH 1.140 03/06/2020   Lab Results  Component Value Date   WBC 10.3 03/06/2020   HGB 15.1 03/06/2020   HCT 45.3 03/06/2020   MCV 89 03/06/2020   PLT 476 (H) 03/06/2020   Lab Results  Component Value Date   NA 138 03/06/2020   K 5.5 (H) 03/06/2020   CO2 23 03/06/2020   GLUCOSE 83 03/06/2020   BUN 9 03/06/2020   CREATININE 1.20 (H) 03/06/2020   BILITOT 0.5 03/06/2020   ALKPHOS 120 03/06/2020   AST 17 03/06/2020   ALT 13 03/06/2020   PROT 7.6 03/06/2020   ALBUMIN 4.2 03/06/2020   CALCIUM 10.2 03/06/2020   Lab Results  Component Value Date   CHOL 220 (H) 03/06/2020   Lab Results  Component Value Date   HDL 44 03/06/2020   Lab Results  Component Value Date   LDLCALC 149 (H) 03/06/2020   Lab Results  Component Value  Date   TRIG 151 (H) 03/06/2020   Lab Results  Component Value Date   CHOLHDL 5.0 (H) 03/06/2020   Lab Results  Component  Value Date   HGBA1C 5.2 03/06/2020       Assessment & Plan:  1. Abscess of chest (HCC) Start bactrim DS twice daily for next 10 days. Advised her to apply very warm compress to effected area as needed to help facilitate drainage. Sample lidocaine cream given to the patient. Advised she apply very small amount to effected areas two to three times daily to help reduce pain. Urgent referral to dermatology for consideration of incision and drainage.  - Ambulatory referral to Dermatology - sulfamethoxazole-trimethoprim (BACTRIM DS) 800-160 MG tablet; Take 1 tablet by mouth 2 (two) times daily.  Dispense: 20 tablet; Refill: 0 Problem List Items Addressed This Visit      Other   Abscess of chest ([facility]) - Primary   Relevant Medications   sulfamethoxazole-trimethoprim (BACTRIM DS) 800-160 MG tablet   Other Relevant Orders   Ambulatory referral to Dermatology       Meds ordered this encounter  Medications  . DISCONTD: sulfamethoxazole-trimethoprim (BACTRIM DS) 800-160 MG tablet    Sig: Take 1 tablet by mouth 2 (two) times daily.    Dispense:  20 tablet    Refill:  0  . sulfamethoxazole-trimethoprim (BACTRIM DS) 800-160 MG tablet    Sig: Take 1 tablet by mouth 2 (two) times daily.    Dispense:  20 tablet    Refill:  0    Order Specific Question:   Supervising Provider    Answer:   Beatrice Lecher D [[id number]]   Time spent with the patient was approximately 30 minutes. This time included reviewing progress notes, labs, imaging studies, and discussing plan for follow up.   Ronnell Freshwater, NP

## 2020-07-18 ENCOUNTER — Encounter: Payer: Self-pay | Admitting: Physician Assistant

## 2020-07-19 ENCOUNTER — Telehealth: Payer: BC Managed Care – PPO | Admitting: Physician Assistant

## 2020-07-19 DIAGNOSIS — U071 COVID-19: Secondary | ICD-10-CM

## 2020-07-19 NOTE — Progress Notes (Signed)
I have spent 5 minutes in review of e-visit questionnaire, review and updating patient chart, medical decision making and response to patient.   Charrisse Masley Cody Victorious Kundinger, PA-C    

## 2020-07-19 NOTE — Progress Notes (Signed)
E-Visit for Positive Covid Test Result We are sorry you are not feeling well. We are here to help!  You have tested positive for COVID-19, meaning that you were infected with the novel coronavirus and could give the virus to others.  It is vitally important that you stay home so you do not spread it to others.      Please continue isolation at home, for at least 10 days since the start of your symptoms and until you have had 24 hours with no fever (without taking a fever reducer) and with improving of symptoms.  If you have no symptoms but tested positive (or all symptoms resolve after 5 days and you have no fever) you can leave your house but continue to wear a mask around others for an additional 5 days. If you have a fever,continue to stay home until you have had 24 hours of no fever. Most cases improve 5-10 days from onset but we have seen a small number of patients who have gotten worse after the 10 days.  Please be sure to watch for worsening symptoms and remain taking the proper precautions.   Go to the nearest hospital ED for assessment if fever/cough/breathlessness are severe or illness seems like a threat to life.    The following symptoms may appear 2-14 days after exposure: . Fever . Cough . Shortness of breath or difficulty breathing . Chills . Repeated shaking with chills . Muscle pain . Headache . Sore throat . New loss of taste or smell . Fatigue . Congestion or runny nose . Nausea or vomiting . Diarrhea  You have been enrolled in [place] for COVID-19. Daily you will receive a questionnaire within the [facility] website. Our COVID-19 response team will be monitoring your responses daily. You can use medication such as Delsym OTC or Mucinex OTC for cough and congestion, saline nasal rinses for nasal congestion. You may also take acetaminophen (Tylenol) as needed for fever.  Dizziness if mild is likely related to need of rincreased fluids. Consider adding on  an electrolyte water like G2 gatorade or pedialyte. If not improving, or worsening, or you are having severe dizziness at present, you need evaluation at local Urgent Care or ER facility to assess for other, more concerning causes.   HOME CARE: . Only take medications as instructed by your medical team. . Drink plenty of fluids and get plenty of rest. . A steam or ultrasonic humidifier can help if you have congestion.   GET HELP RIGHT AWAY IF YOU HAVE EMERGENCY WARNING SIGNS.  Call 911 or proceed to your closest emergency facility if: . You develop worsening high fever. . Trouble breathing . Bluish lips or face . Persistent pain or pressure in the chest . New confusion . Inability to wake or stay awake . You cough up blood. . Your symptoms become more severe . Inability to hold down food or fluids  This list is not all possible symptoms. Contact your medical provider for any symptoms that are severe or concerning to you.    Your e-visit answers were reviewed by a board certified advanced clinical practitioner to complete your personal care plan.  Depending on the condition, your plan could have included both over the counter or prescription medications.  If there is a problem please reply once you have received a response from your provider.  Your safety is important to [place].  If you have drug allergies check your prescription carefully.    You can  use MyChart to ask questions about today's visit, request a non-urgent call back, or ask for a work or school excuse for 24 hours related to this e-Visit. If it has been greater than 24 hours you will need to follow up with your provider, or enter a new e-Visit to address those concerns. You will get an e-mail in the next two days asking about your experience.  I hope that your e-visit has been valuable and will speed your recovery. Thank you for using e-visits.

## 2020-07-25 ENCOUNTER — Other Ambulatory Visit: Payer: Self-pay | Admitting: Neurology

## 2020-07-29 ENCOUNTER — Other Ambulatory Visit: Payer: Self-pay | Admitting: Neurology

## 2020-07-29 ENCOUNTER — Encounter: Payer: Self-pay | Admitting: Neurology

## 2020-09-29 ENCOUNTER — Telehealth: Payer: BC Managed Care – PPO | Admitting: Orthopedic Surgery

## 2020-09-29 DIAGNOSIS — R0981 Nasal congestion: Secondary | ICD-10-CM | POA: Diagnosis not present

## 2020-09-29 MED ORDER — FLUTICASONE PROPIONATE 50 MCG/ACT NA SUSP: 2.0000 | Freq: Every day | NASAL | 0 refills | Status: DC

## 2020-09-29 MED ORDER — NAPROXEN 500 MG PO TABS: 500.0000 mg | ORAL_TABLET | Freq: Two times a day (BID) | ORAL | 0 refills | Status: DC

## 2020-09-29 NOTE — Progress Notes (Signed)
E-Visit for Corona Virus Screening  Your current symptoms could be consistent with the coronavirus.  Many health care providers can now test patients at their office but not all are.  [facility] has multiple testing sites. For information on our [facility] testing locations and hours go to [email or web address]  We are enrolling you in our [facility] for [place] . Daily you will receive a questionnaire within the [facility] website. Our COVID 19 response team will be monitoring your responses daily.  Testing Information: The COVID-19 Community Testing sites are testing BY APPOINTMENT ONLY.  You can schedule online at [email or web address]  If you do not have access to a smart phone or computer you may call [phone] for an appointment.   Additional testing sites in the Community:  For [facility] Testing sites in [place]  FaceUpdate.[email or web address]  For Pop-up testing sites in [place]  [email or web address]  For [facility] [email or web address]  For [facility] testing in [place] and [employer] [email or web address]  For [facility] testing in [place]   [email or web address]  For  more information about community testing call [phone]   Please quarantine yourself while awaiting your test results. Please stay home for a minimum of 10 days from the first day of illness with improving symptoms and you have had 24 hours of no fever (without the use of Tylenol (Acetaminophen) Motrin (Ibuprofen) or any fever reducing medication).  Also - Do not get tested prior to returning to work because once you have had a positive test the test can stay positive for  more than a month in some cases.   You should wear a mask or cloth face covering over your nose and mouth if you must be around other people or animals, including pets (even at home). Try to stay at least 6 feet away from other people. This will protect the people around you.  Please continue good preventive care measures, including:  frequent hand-washing, avoid touching your face, cover coughs/sneezes, stay out of crowds and keep a 6 foot distance from others.  COVID-19 is a respiratory illness with symptoms that are similar to the flu. Symptoms are typically mild to moderate, but there have been cases of severe illness and death due to the virus.   The following symptoms may appear 2-14 days after exposure: Fever Cough Shortness of breath or difficulty breathing Chills Repeated shaking with chills Muscle pain Headache Sore throat New loss of taste or smell Fatigue Congestion or runny nose Nausea or vomiting Diarrhea  Go to the nearest hospital ED for assessment if fever/cough/breathlessness are severe or illness seems like a threat to life.  It is vitally important that if you feel that you have an infection such as this virus or any other virus that you stay home and away from places where you may spread it to others.  You should avoid contact with people age 71 and older.   You can use medication such as prescription anti-inflammatory called Naprosyn 500 mg. Take twice daily as needed for fever or body aches for 2 weeks and prescription for Fluticasone nasal spray 2 sprays in each nostril one time per day  You may also take acetaminophen (Tylenol) as needed for fever.  Reduce your risk of any infection by using the same precautions used for avoiding the common cold or flu:  Wash your hands often with soap and warm water for at least 20 seconds.  If soap and water are not readily available, use an alcohol-based hand  sanitizer with at least 60% alcohol.  If coughing or sneezing, cover  your mouth and nose by coughing or sneezing into the elbow areas of your shirt or coat, into a tissue or into your sleeve (not your hands). Avoid shaking hands with others and consider head nods or verbal greetings only. Avoid touching your eyes, nose, or mouth with unwashed hands.  Avoid close contact with people who are sick. Avoid places or events with large numbers of people in one location, like concerts or sporting events. Carefully consider travel plans you have or are making. If you are planning any travel outside or inside the [place], visit the CDC's Travelers' Health webpage for the latest health notices. If you have some symptoms but not all symptoms, continue to monitor at home and seek medical attention if your symptoms worsen. If you are having a medical emergency, call 911.  HOME CARE Only take medications as instructed by your medical team. Drink plenty of fluids and get plenty of rest. A steam or ultrasonic humidifier can help if you have congestion.   GET HELP RIGHT AWAY IF YOU HAVE EMERGENCY WARNING SIGNS** FOR COVID-19. If you or someone is showing any of these signs seek emergency medical care immediately. Call 911 or proceed to your closest emergency facility if: You develop worsening high fever. Trouble breathing Bluish lips or face Persistent pain or pressure in the chest New confusion Inability to wake or stay awake You cough up blood. Your symptoms become more severe  **This list is not all possible symptoms. Contact your medical provider for any symptoms that are sever or concerning to you.  MAKE SURE YOU  Understand these instructions. Will watch your condition. Will get help right away if you are not doing well or get worse.  Your e-visit answers were reviewed by a board certified advanced clinical practitioner to complete your personal care plan.  Depending on the condition, your plan could have included both over the counter or prescription medications.  If  there is a problem please reply once you have received a response from your provider.  Your safety is important to [place].  If you have drug allergies check your prescription carefully.    You can use MyChart to ask questions about today's visit, request a non-urgent call back, or ask for a work or school excuse for 24 hours related to this e-Visit. If it has been greater than 24 hours you will need to follow up with your provider, or enter a new e-Visit to address those concerns. You will get an e-mail in the next two days asking about your experience.  I hope that your e-visit has been valuable and will speed your recovery. Thank you for using e-visits.   Greater than 5 minutes, yet less than 10 minutes of time have been spent researching, coordinating and implementing care for this patient today.

## 2020-10-18 DIAGNOSIS — S0500XA Injury of conjunctiva and corneal abrasion without foreign body, unspecified eye, initial encounter: Secondary | ICD-10-CM | POA: Diagnosis not present

## 2020-11-06 ENCOUNTER — Ambulatory Visit: Payer: BC Managed Care – PPO | Admitting: Physician Assistant

## 2020-12-04 DIAGNOSIS — Z713 Dietary counseling and surveillance: Secondary | ICD-10-CM | POA: Diagnosis not present

## 2020-12-04 IMAGING — MG DIGITAL DIAGNOSTIC BILATERAL MAMMOGRAM WITH TOMO AND CAD
8 series · 8 of 24 positions shown · non-contrast
Comparison: Previous exam(s).

CLINICAL DATA: 42-year-old female for further evaluation of
possible bilateral breast masses on screening mammogram

EXAM:
DIGITAL DIAGNOSTIC BILATERAL MAMMOGRAM WITH CAD AND TOMO
ULTRASOUND BILATERAL BREAST

[L CC synth-2D]
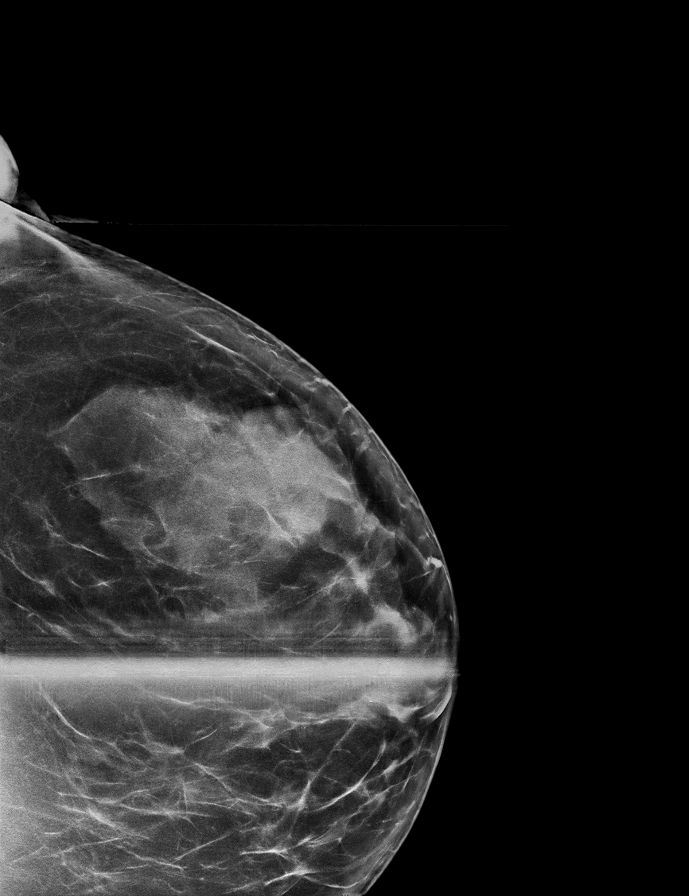

[L MLO synth-2D]
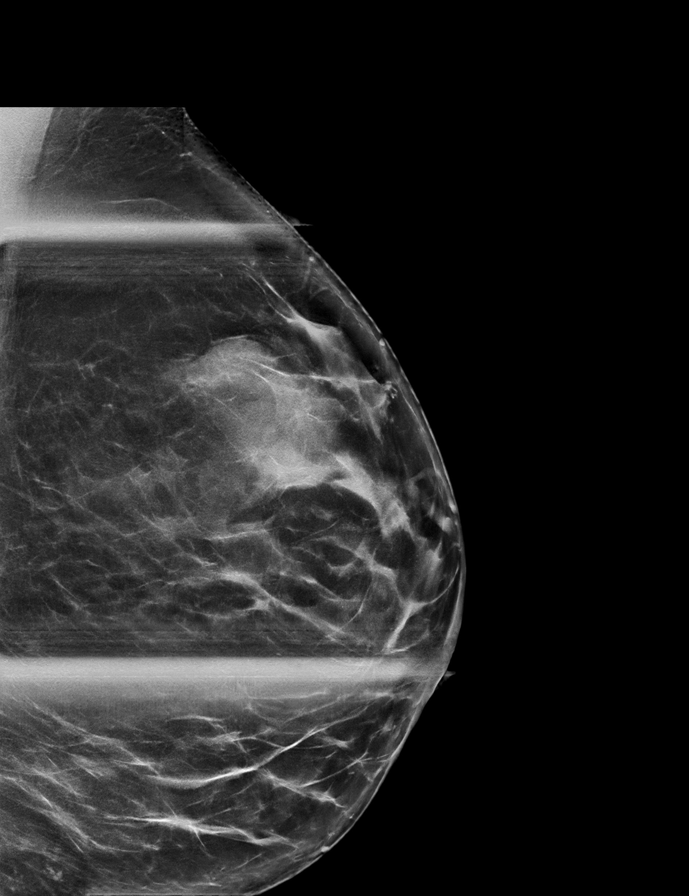

[R CC synth-2D]
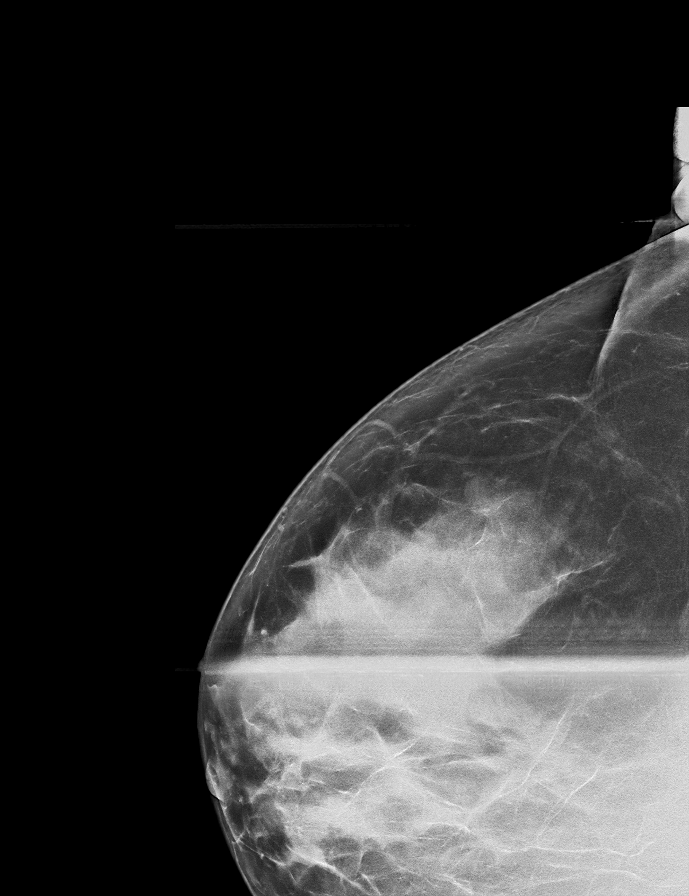

[R MLO synth-2D]
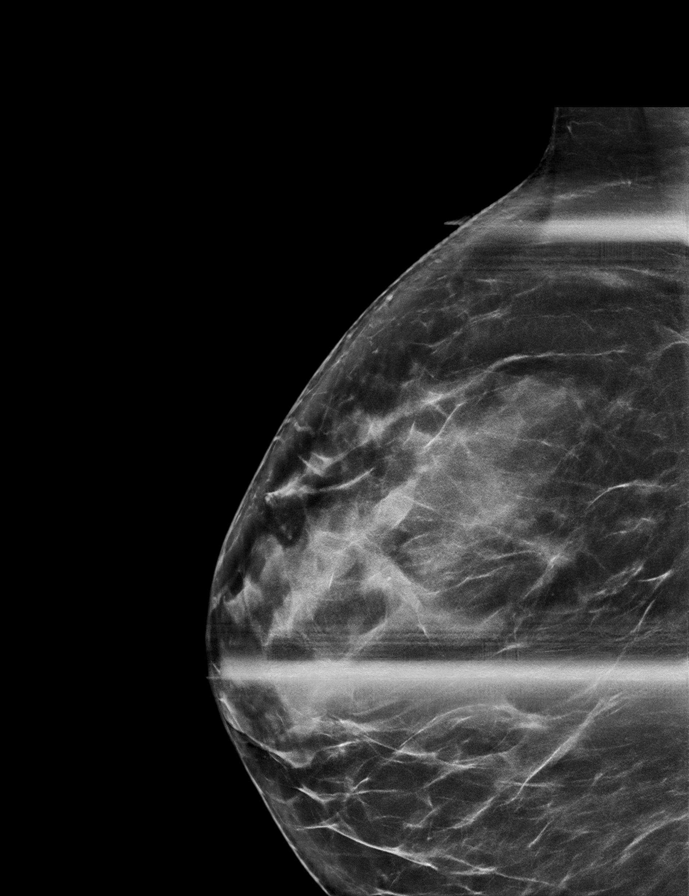

[R CC tomo · tomo slice 39/78.0]
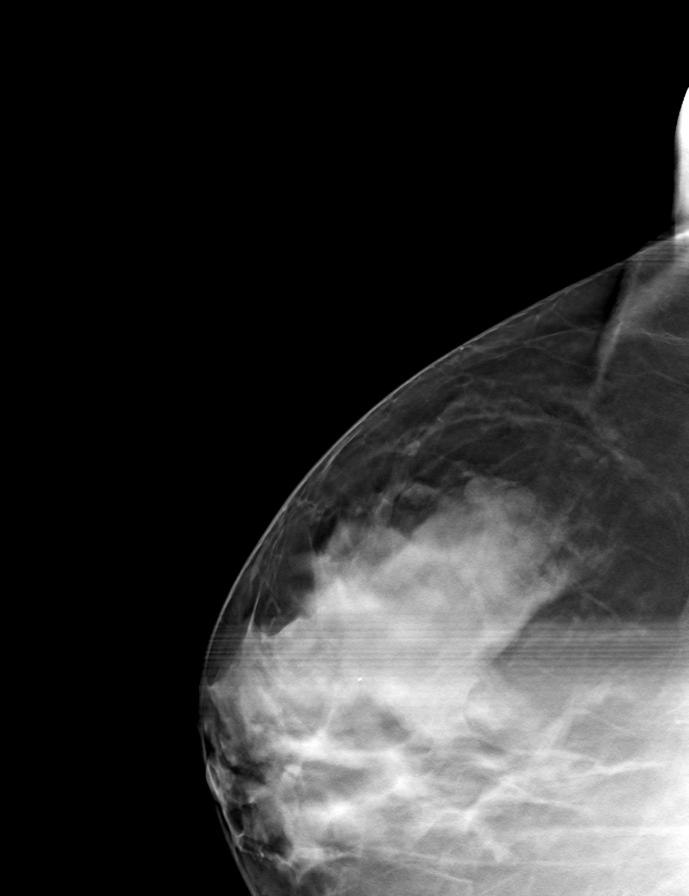

[L CC tomo · tomo slice 40/79.0]
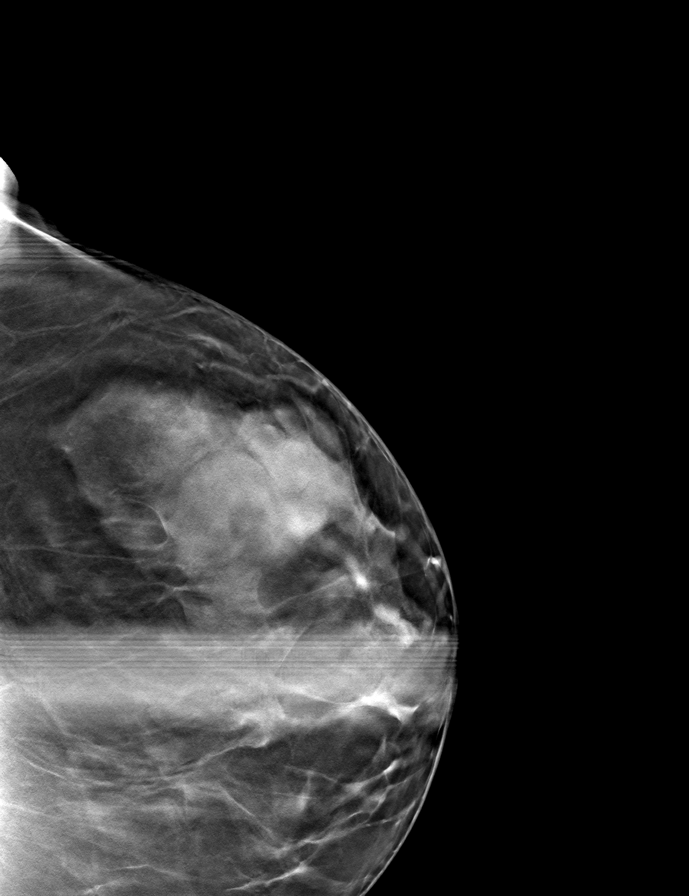

[R MLO tomo · tomo slice 41/82.0]
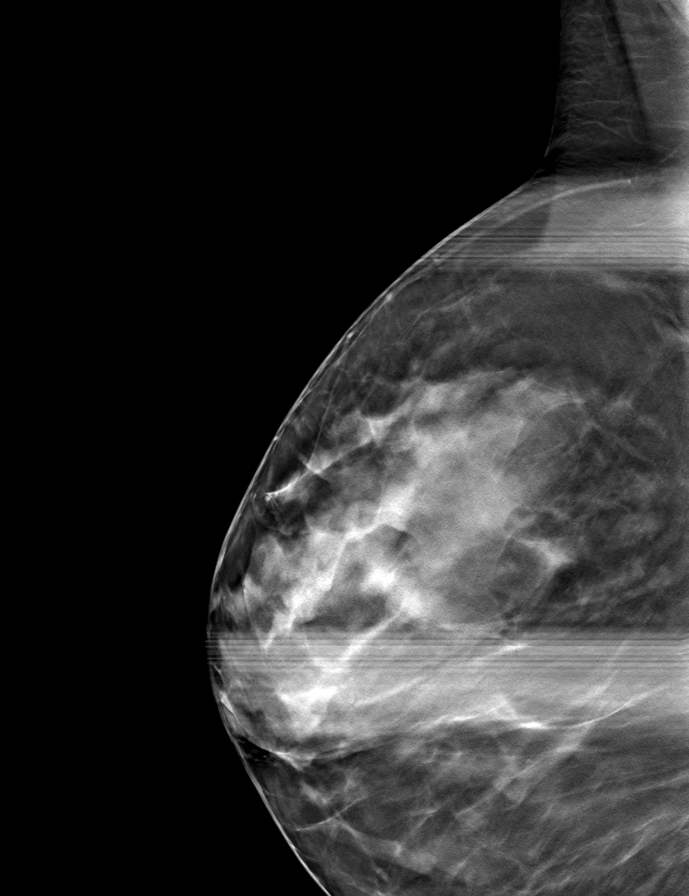

[L MLO tomo · tomo slice 39/76.0]
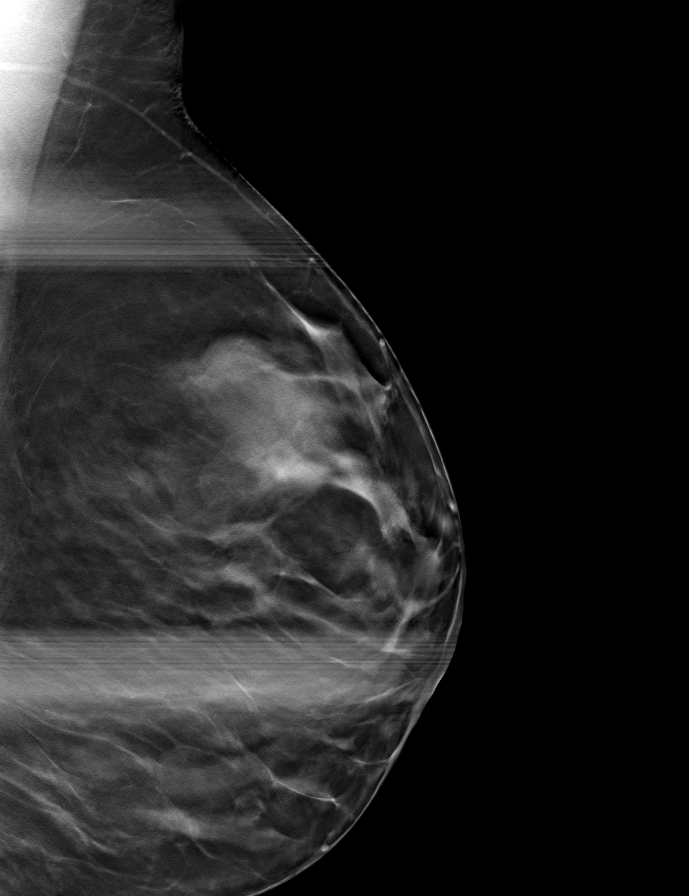

[8 of 24 positions shown; findings below may reference images not displayed]

ACR Breast Density Category c: The breast tissue is heterogeneously
dense, which may obscure small masses.
FINDINGS: 2D/3D spot compression views of both breasts demonstrate a
circumscribed oval mass within the UPPER-OUTER RIGHT breast and
circumscribed oval mass within the UPPER-OUTER LEFT breast.

Mammographic images were processed with CAD.

Targeted ultrasound is performed, showing a 0.7 x 0.5 x 0.6 cm
benign simple cyst at the 10 o'clock position the RIGHT breast 8 cm
from the nipple and a 3.3 x 1.2 x 3 cm benign cyst at the 2 o'clock
position of the LEFT breast 5 cm from the nipple, corresponding to
the screening study findings.
IMPRESSION: Benign cysts within the UPPER OUTER breasts, corresponding to the
screening study findings.

RECOMMENDATION:
Bilateral screening mammogram in 1 year

I have discussed the findings and recommendations with the patient.
Results were also provided in writing at the conclusion of the
visit. If applicable, a reminder letter will be sent to the patient
regarding the next appointment.

BI-RADS CATEGORY  2: Benign.

## 2021-03-03 ENCOUNTER — Other Ambulatory Visit: Payer: Self-pay

## 2021-03-03 ENCOUNTER — Other Ambulatory Visit: Payer: BC Managed Care – PPO

## 2021-03-03 DIAGNOSIS — R002 Palpitations: Secondary | ICD-10-CM | POA: Diagnosis not present

## 2021-03-03 DIAGNOSIS — Z0000 Encounter for general adult medical examination without abnormal findings: Secondary | ICD-10-CM

## 2021-03-03 DIAGNOSIS — E7800 Pure hypercholesterolemia, unspecified: Secondary | ICD-10-CM | POA: Diagnosis not present

## 2021-03-03 DIAGNOSIS — D75839 Thrombocytosis, unspecified: Secondary | ICD-10-CM | POA: Diagnosis not present

## 2021-03-03 DIAGNOSIS — G43909 Migraine, unspecified, not intractable, without status migrainosus: Secondary | ICD-10-CM | POA: Diagnosis not present

## 2021-03-04 LAB — COMPREHENSIVE METABOLIC PANEL
ALT: 11 IU/L (ref 0–32)
AST: 16 IU/L (ref 0–40)
Albumin/Globulin Ratio: 1.8 (ref 1.2–2.2)
Albumin: 4.6 g/dL (ref 3.8–4.8)
Alkaline Phosphatase: 70 IU/L (ref 44–121)
BUN/Creatinine Ratio: 11 (ref 9–23)
BUN: 11 mg/dL (ref 6–24)
Bilirubin Total: 0.5 mg/dL (ref 0.0–1.2)
CO2: 23 mmol/L (ref 20–29)
Calcium: 9.7 mg/dL (ref 8.7–10.2)
Chloride: 99 mmol/L (ref 96–106)
Creatinine, Ser: 0.97 mg/dL (ref 0.57–1.00)
Globulin, Total: 2.5 g/dL (ref 1.5–4.5)
Glucose: 86 mg/dL (ref 70–99)
Potassium: 5.2 mmol/L (ref 3.5–5.2)
Sodium: 136 mmol/L (ref 134–144)
Total Protein: 7.1 g/dL (ref 6.0–8.5)
eGFR: 73 mL/min/{1.73_m2} (ref >59)

## 2021-03-04 LAB — HEMOGLOBIN A1C
Est. average glucose Bld gHb Est-mCnc: 114 mg/dL
Hgb A1c MFr Bld: 5.6 % (ref 4.8–5.6)

## 2021-03-04 LAB — CBC WITH DIFFERENTIAL/PLATELET
Basophils Absolute: 0 10*3/uL (ref 0.0–0.2)
Basos: 0 %
EOS (ABSOLUTE): 0.2 10*3/uL (ref 0.0–0.4)
Eos: 2 %
Hematocrit: 41.7 % (ref 34.0–46.6)
Hemoglobin: 14 g/dL (ref 11.1–15.9)
Immature Grans (Abs): 0 10*3/uL (ref 0.0–0.1)
Immature Granulocytes: 0 %
Lymphocytes Absolute: 2.1 10*3/uL (ref 0.7–3.1)
Lymphs: 23 %
MCH: 30 pg (ref 26.6–33.0)
MCHC: 33.6 g/dL (ref 31.5–35.7)
MCV: 89 fL (ref 79–97)
Monocytes Absolute: 0.6 10*3/uL (ref 0.1–0.9)
Monocytes: 6 %
Neutrophils Absolute: 6.2 10*3/uL (ref 1.4–7.0)
Neutrophils: 69 %
Platelets: 359 10*3/uL (ref 150–450)
RBC: 4.67 x10E6/uL (ref 3.77–5.28)
RDW: 11.7 % (ref 11.7–15.4)
WBC: 9.2 10*3/uL (ref 3.4–10.8)

## 2021-03-04 LAB — TSH: TSH: 1.46 u[IU]/mL (ref 0.450–4.500)

## 2021-03-04 LAB — LIPID PANEL
Chol/HDL Ratio: 3.4 ratio (ref 0.0–4.4)
Cholesterol, Total: 179 mg/dL (ref 100–199)
HDL: 53 mg/dL (ref >39)
LDL Chol Calc (NIH): 109 mg/dL — ABNORMAL HIGH (ref 0–99)
Triglycerides: 96 mg/dL (ref 0–149)
VLDL Cholesterol Cal: 17 mg/dL (ref 5–40)

## 2021-03-05 ENCOUNTER — Encounter: Payer: Self-pay | Admitting: Physician Assistant

## 2021-03-05 ENCOUNTER — Ambulatory Visit (INDEPENDENT_AMBULATORY_CARE_PROVIDER_SITE_OTHER): Payer: BC Managed Care – PPO | Admitting: Physician Assistant

## 2021-03-05 ENCOUNTER — Other Ambulatory Visit: Payer: Self-pay

## 2021-03-05 VITALS — BP 101/69 | HR 90 | Temp 97.9°F | Ht 66.0 in | Wt 145.0 lb

## 2021-03-05 DIAGNOSIS — Z0000 Encounter for general adult medical examination without abnormal findings: Secondary | ICD-10-CM | POA: Diagnosis not present

## 2021-03-05 DIAGNOSIS — E7800 Pure hypercholesterolemia, unspecified: Secondary | ICD-10-CM

## 2021-03-05 NOTE — Patient Instructions (Signed)

## 2021-03-05 NOTE — Progress Notes (Signed)
Subjective:     Janice Silva is a 45 y.o. female and is here for a comprehensive physical exam. The patient reports no problems.  Social History   Socioeconomic History   Marital status: Married    Spouse name: Not on file   Number of children: 2   Years of education: Not on file   Highest education level: Some college, no degree  Occupational History   Occupation: Regulatory affairs officer (paper)  Tobacco Use   Smoking status: Some Days    Packs/day: 0.25    Years: 25.00    Pack years: 6.25    Types: Cigarettes   Smokeless tobacco: Never  Vaping Use   Vaping Use: Never used  Substance and Sexual Activity   Alcohol use: Yes    Alcohol/week: 0.0 standard drinks    Comment: social   Drug use: No   Sexual activity: Yes    Birth control/protection: Pill  Other Topics Concern   Not on file  Social History Narrative   Right handed    Lives at home with husband and 2 daughters    1 cup of caffeine daily    Social Determinants of Health   Financial Resource Strain: Not on file  Food Insecurity: Not on file  Transportation Needs: Not on file  Physical Activity: Not on file  Stress: Not on file  Social Connections: Not on file  Intimate Partner Violence: Not on file   Health Maintenance  Topic Date Due   COVID-19 Vaccine (1) Never done   Pneumococcal Vaccine 24-73 Years old (1 - PCV) Never done   Hepatitis C Screening  Never done   TETANUS/TDAP  Never done   INFLUENZA VACCINE  10/14/2020   COLONOSCOPY (Pts 45-50yrs Insurance coverage will need to be confirmed)  Never done   PAP SMEAR-Modifier  02/15/2021   HIV Screening  Completed   HPV VACCINES  Aged Out    The following portions of the patient's history were reviewed and updated as appropriate: allergies, current medications, past family history, past medical history, past social history, past surgical history, and problem list.  Review of Systems Pertinent items noted in HPI and remainder of comprehensive ROS  otherwise negative.   Objective:    BP 101/69    Pulse 90    Temp 97.9 F (36.6 C)    Ht 5\' 6"  (1.676 m)    Wt 145 lb (65.8 kg)    SpO2 100%    BMI 23.40 kg/m  General appearance: alert, cooperative, and no distress Head: Normocephalic, without obvious abnormality, atraumatic Eyes: conjunctivae/corneas clear. PERRL, EOM's intact. Fundi benign. Ears: normal TM's and external ear canals both ears Nose: Nares normal. Septum midline. Mucosa normal. No drainage or sinus tenderness. Throat: lips, mucosa, and tongue normal; teeth and gums normal Neck: no adenopathy, no JVD, supple, symmetrical, trachea midline, and thyroid: normal to inspection and palpation Back: symmetric, no curvature. ROM normal. No CVA tenderness. Lungs: clear to auscultation bilaterally Heart: regular rate and rhythm and S1, S2 normal Abdomen: soft, non-tender; bowel sounds normal; no masses,  no organomegaly Extremities: extremities normal, atraumatic, no cyanosis or edema Pulses: 2+ and symmetric Skin: Skin color, texture, turgor normal. No rashes or lesions Lymph nodes: Cervical adenopathy: normal and Supraclavicular adenopathy: normal Neurologic: Grossly normal    Assessment:    Healthy female exam.     Plan:  -Discussed most recent labs which are essentially within normal limits or stable from prior. Cholesterol panel, kidney function and platelets have  improved from prior and are normal. -The 10-year ASCVD risk score (Arnett DK, et al., 2019) is: 1.5% -Patient is scheduled with OB/GYN next month for female exam and mammogram. Will obtain medical release to obtain results next month. -Patient deferred immunizations and colonoscopy.  -Encourage to continue weight loss efforts with dietary and lifestyle changes. -Follow up in 1 year for CPE and FBW  See After Visit Summary for Counseling Recommendations

## 2021-03-24 ENCOUNTER — Encounter: Payer: Self-pay | Admitting: Neurology

## 2021-03-24 NOTE — Telephone Encounter (Signed)
Called and LVM of new appt date and time in Feb.

## 2021-03-25 LAB — HM MAMMOGRAPHY

## 2021-03-27 ENCOUNTER — Ambulatory Visit: Payer: BC Managed Care – PPO | Admitting: Neurology

## 2021-03-27 DIAGNOSIS — Z124 Encounter for screening for malignant neoplasm of cervix: Secondary | ICD-10-CM | POA: Diagnosis not present

## 2021-03-27 DIAGNOSIS — Z01419 Encounter for gynecological examination (general) (routine) without abnormal findings: Secondary | ICD-10-CM | POA: Diagnosis not present

## 2021-04-01 ENCOUNTER — Other Ambulatory Visit: Payer: Self-pay | Admitting: Neurology

## 2021-04-02 LAB — HM PAP SMEAR: HM Pap smear: NEGATIVE

## 2021-04-16 ENCOUNTER — Encounter: Payer: Self-pay | Admitting: Physician Assistant

## 2021-05-01 ENCOUNTER — Encounter: Payer: Self-pay | Admitting: Neurology

## 2021-05-01 ENCOUNTER — Other Ambulatory Visit: Payer: Self-pay | Admitting: *Deleted

## 2021-05-01 MED ORDER — NORTRIPTYLINE HCL 10 MG PO CAPS: 30.0000 mg | ORAL_CAPSULE | Freq: Every day | ORAL | 0 refills | Status: DC

## 2021-05-07 ENCOUNTER — Encounter: Payer: Self-pay | Admitting: Neurology

## 2021-05-08 ENCOUNTER — Encounter: Payer: Self-pay | Admitting: Neurology

## 2021-05-08 ENCOUNTER — Telehealth (INDEPENDENT_AMBULATORY_CARE_PROVIDER_SITE_OTHER): Payer: BC Managed Care – PPO | Admitting: Neurology

## 2021-05-08 DIAGNOSIS — G44229 Chronic tension-type headache, not intractable: Secondary | ICD-10-CM

## 2021-05-08 MED ORDER — NORTRIPTYLINE HCL 10 MG PO CAPS: 30.0000 mg | ORAL_CAPSULE | Freq: Every day | ORAL | 3 refills | Status: DC

## 2021-05-08 NOTE — Progress Notes (Signed)
° °  Virtual Visit via Video Note  I connected with Janice Silva on 05/08/21 at 12:45 PM EST by a video enabled telemedicine application and verified that I am speaking with the correct person using two identifiers.  Location: Patient: at her work Provider: in the office    I discussed the limitations of evaluation and management by telemedicine and the availability of in person appointments. The patient expressed understanding and agreed to proceed.  History of Present Illness: 05/08/21 SS: Janice Silva here today for follow-up for headaches.  Described as sensation of fullness to the head.  She has done very well with nortriptyline.  Currently taking 30 mg at bedtime.  Over the last year, she may have had 3 or 4 episodes.  She did try to reduce nortriptyline to 20 mg at bedtime, during that time her mom was ill, she ended up increasing the dose back to 30 mg this was related to the stress.  She is motivated to try to decrease the dose again.  03/27/2020 SS: Janice Silva is a 46 year old female with history of fullness of the head, form of tension headache.  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  She has had good benefit with nortriptyline, at last visit, she reduced her dose to 30 mg at bedtime.  Continues to do well on lower dose.  No recurrent headache or sensation of fullness.  She seems to tolerate the nortriptyline well.  No adverse effects.  She continues to work full-time.  No changes to her overall health.  Presents today for evaluation unaccompanied.   Observations/Objective: Via virtual visit, is alert and oriented, speech is clear and concise, facial symmetry noted, moving about freely  Assessment and Plan: 1.  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 The patient was provided an opportunity to ask questions and all were answered. The patient agreed with the plan and demonstrated an understanding of the instructions.   The patient was advised to call back or seek an in-person evaluation if the symptoms worsen or if the condition fails to improve as anticipated.   Evangeline Dakin, DNP  [facility] [address] [place], [place] [address] [phone]

## 2021-10-22 ENCOUNTER — Encounter (INDEPENDENT_AMBULATORY_CARE_PROVIDER_SITE_OTHER): Payer: Self-pay

## 2021-12-02 ENCOUNTER — Encounter: Payer: Self-pay | Admitting: Neurology

## 2021-12-02 MED ORDER — NORTRIPTYLINE HCL 10 MG PO CAPS
10.0000 mg | ORAL_CAPSULE | Freq: Every day | ORAL | 1 refills | Status: DC
Start: 2021-12-02 — End: 2023-08-03

## 2021-12-17 DIAGNOSIS — Z713 Dietary counseling and surveillance: Secondary | ICD-10-CM | POA: Diagnosis not present

## 2022-04-16 ENCOUNTER — Other Ambulatory Visit: Payer: BC Managed Care – PPO

## 2022-04-23 ENCOUNTER — Encounter: Payer: BC Managed Care – PPO | Admitting: Physician Assistant

## 2022-04-23 DIAGNOSIS — Z1231 Encounter for screening mammogram for malignant neoplasm of breast: Secondary | ICD-10-CM | POA: Diagnosis not present

## 2022-04-23 DIAGNOSIS — Z01419 Encounter for gynecological examination (general) (routine) without abnormal findings: Secondary | ICD-10-CM | POA: Diagnosis not present

## 2022-05-12 NOTE — Progress Notes (Deleted)
   Virtual Visit via Video Note  I connected with Janice Silva on 05/12/22 at  4:00 PM EST by a video enabled telemedicine application and verified that I am speaking with the correct person using two identifiers.  Location: Patient: at her work Provider: in the office    I discussed the limitations of evaluation and management by telemedicine and the availability of in person appointments. The patient expressed understanding and agreed to proceed.  History of Present Illness: 05/13/22 SS:   05/08/21 SS: Janice Silva here today for follow-up for headaches.  Described as sensation of fullness to the head.  She has done very well with nortriptyline.  Currently taking 30 mg at bedtime.  Over the last year, she may have had 3 or 4 episodes.  She did try to reduce nortriptyline to 20 mg at bedtime, during that time her mom was ill, she ended up increasing the dose back to 30 mg this was related to the stress.  She is motivated to try to decrease the dose again.  03/27/2020 SS: Janice Silva is a 47 year old female with history of fullness of the head, form of tension headache.  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  She has had good benefit with nortriptyline, at last visit, she reduced her dose to 30 mg at bedtime.  Continues to do well on lower dose.  No recurrent headache or sensation of fullness.  She seems to tolerate the nortriptyline well.  No adverse effects.  She continues to work full-time.  No changes to her overall health.  Presents today for evaluation unaccompanied.   Observations/Objective: Via virtual visit, is alert and oriented, speech is clear and concise, facial symmetry noted, moving about freely  Assessment and Plan: 1.  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 The patient was provided an opportunity to ask questions and all were answered. The patient agreed with the plan and demonstrated an understanding of the instructions.   The patient was advised to call back or seek an in-person evaluation if the symptoms worsen or if the condition fails to improve as anticipated.   Janice Dakin, DNP  [facility] [address] [place],  [address] [phone]

## 2022-05-13 ENCOUNTER — Telehealth: Payer: BC Managed Care – PPO | Admitting: Neurology

## 2022-05-13 ENCOUNTER — Telehealth: Payer: Self-pay | Admitting: Neurology

## 2022-05-13 NOTE — Telephone Encounter (Signed)
Patient did not sign on for my chart visit, I called and left a message.

## 2022-05-18 ENCOUNTER — Other Ambulatory Visit: Payer: BC Managed Care – PPO

## 2022-05-25 ENCOUNTER — Other Ambulatory Visit: Payer: Self-pay

## 2022-05-25 ENCOUNTER — Encounter: Payer: BC Managed Care – PPO | Admitting: Physician Assistant

## 2022-05-25 DIAGNOSIS — Z130 Encounter for screening for diseases of the blood and blood-forming organs and certain disorders involving the immune mechanism: Secondary | ICD-10-CM

## 2022-05-25 DIAGNOSIS — Z0000 Encounter for general adult medical examination without abnormal findings: Secondary | ICD-10-CM

## 2022-05-27 ENCOUNTER — Telehealth: Payer: BC Managed Care – PPO | Admitting: Neurology

## 2022-05-27 ENCOUNTER — Telehealth: Payer: Self-pay | Admitting: Neurology

## 2022-05-27 ENCOUNTER — Other Ambulatory Visit: Payer: BC Managed Care – PPO

## 2022-05-27 DIAGNOSIS — Z0000 Encounter for general adult medical examination without abnormal findings: Secondary | ICD-10-CM

## 2022-05-27 DIAGNOSIS — Z1329 Encounter for screening for other suspected endocrine disorder: Secondary | ICD-10-CM | POA: Diagnosis not present

## 2022-05-27 DIAGNOSIS — Z130 Encounter for screening for diseases of the blood and blood-forming organs and certain disorders involving the immune mechanism: Secondary | ICD-10-CM

## 2022-05-27 DIAGNOSIS — Z131 Encounter for screening for diabetes mellitus: Secondary | ICD-10-CM | POA: Diagnosis not present

## 2022-05-27 DIAGNOSIS — E7800 Pure hypercholesterolemia, unspecified: Secondary | ICD-10-CM | POA: Diagnosis not present

## 2022-05-27 NOTE — Progress Notes (Deleted)
   Virtual Visit via Video Note  I connected with Janice Silva on 05/27/22 at  2:00 PM EDT by a video enabled telemedicine application and verified that I am speaking with the correct person using two identifiers.  Location: Patient: at her work Provider: in the office    I discussed the limitations of evaluation and management by telemedicine and the availability of in person appointments. The patient expressed understanding and agreed to proceed.  History of Present Illness: 05/13/22 SS:   05/08/21 SS: Janice Silva here today for follow-up for headaches.  Described as sensation of fullness to the head.  She has done very well with nortriptyline.  Currently taking 30 mg at bedtime.  Over the last year, she may have had 3 or 4 episodes.  She did try to reduce nortriptyline to 20 mg at bedtime, during that time her mom was ill, she ended up increasing the dose back to 30 mg this was related to the stress.  She is motivated to try to decrease the dose again.  03/27/2020 SS: Janice Silva is a 47 year old female with history of fullness of the head, form of tension headache.  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  She has had good benefit with nortriptyline, at last visit, she reduced her dose to 30 mg at bedtime.  Continues to do well on lower dose.  No recurrent headache or sensation of fullness.  She seems to tolerate the nortriptyline well.  No adverse effects.  She continues to work full-time.  No changes to her overall health.  Presents today for evaluation unaccompanied.   Observations/Objective: Via virtual visit, is alert and oriented, speech is clear and concise, facial symmetry noted, moving about freely  Assessment and Plan: 1.  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 The patient was provided an opportunity to ask questions and all were answered. The patient agreed with the plan and demonstrated an understanding of the instructions.   The patient was advised to call back or seek an in-person evaluation if the symptoms worsen or if the condition fails to improve as anticipated.   Evangeline Dakin, DNP  [facility] [address], [place], [place] [address] [phone]

## 2022-05-27 NOTE — Telephone Encounter (Signed)
I called for my chart video visit, no answer, she did not log on. I left a message.

## 2022-05-28 LAB — CBC WITH DIFFERENTIAL/PLATELET
Basophils Absolute: 0.1 10*3/uL (ref 0.0–0.2)
Basos: 1 %
EOS (ABSOLUTE): 0.2 10*3/uL (ref 0.0–0.4)
Eos: 2 %
Hematocrit: 42.1 % (ref 34.0–46.6)
Hemoglobin: 14.1 g/dL (ref 11.1–15.9)
Immature Grans (Abs): 0 10*3/uL (ref 0.0–0.1)
Immature Granulocytes: 0 %
Lymphocytes Absolute: 2 10*3/uL (ref 0.7–3.1)
Lymphs: 20 %
MCH: 30.5 pg (ref 26.6–33.0)
MCHC: 33.5 g/dL (ref 31.5–35.7)
MCV: 91 fL (ref 79–97)
Monocytes Absolute: 0.6 10*3/uL (ref 0.1–0.9)
Monocytes: 6 %
Neutrophils Absolute: 7.3 10*3/uL — ABNORMAL HIGH (ref 1.4–7.0)
Neutrophils: 71 %
Platelets: 365 10*3/uL (ref 150–450)
RBC: 4.63 x10E6/uL (ref 3.77–5.28)
RDW: 11.7 % (ref 11.7–15.4)
WBC: 10.3 10*3/uL (ref 3.4–10.8)

## 2022-05-28 LAB — TSH: TSH: 1.06 u[IU]/mL (ref 0.450–4.500)

## 2022-05-28 LAB — COMPREHENSIVE METABOLIC PANEL
ALT: 11 IU/L (ref 0–32)
AST: 13 IU/L (ref 0–40)
Albumin/Globulin Ratio: 1.7 (ref 1.2–2.2)
Albumin: 4.4 g/dL (ref 3.9–4.9)
Alkaline Phosphatase: 80 IU/L (ref 44–121)
BUN/Creatinine Ratio: 13 (ref 9–23)
BUN: 11 mg/dL (ref 6–24)
Bilirubin Total: 0.2 mg/dL (ref 0.0–1.2)
CO2: 23 mmol/L (ref 20–29)
Calcium: 10 mg/dL (ref 8.7–10.2)
Chloride: 101 mmol/L (ref 96–106)
Creatinine, Ser: 0.87 mg/dL (ref 0.57–1.00)
Globulin, Total: 2.6 g/dL (ref 1.5–4.5)
Glucose: 76 mg/dL (ref 70–99)
Potassium: 5.4 mmol/L — ABNORMAL HIGH (ref 3.5–5.2)
Sodium: 137 mmol/L (ref 134–144)
Total Protein: 7 g/dL (ref 6.0–8.5)
eGFR: 83 mL/min/{1.73_m2} (ref >59)

## 2022-05-28 LAB — HEMOGLOBIN A1C
Est. average glucose Bld gHb Est-mCnc: 108 mg/dL
Hgb A1c MFr Bld: 5.4 % (ref 4.8–5.6)

## 2022-05-28 LAB — LIPID PANEL
Chol/HDL Ratio: 3.4 ratio (ref 0.0–4.4)
Cholesterol, Total: 180 mg/dL (ref 100–199)
HDL: 53 mg/dL (ref >39)
LDL Chol Calc (NIH): 104 mg/dL — ABNORMAL HIGH (ref 0–99)
Triglycerides: 127 mg/dL (ref 0–149)
VLDL Cholesterol Cal: 23 mg/dL (ref 5–40)

## 2022-06-03 ENCOUNTER — Encounter: Payer: BC Managed Care – PPO | Admitting: Family Medicine

## 2022-06-24 ENCOUNTER — Encounter: Payer: Self-pay | Admitting: Family Medicine

## 2022-06-24 ENCOUNTER — Ambulatory Visit (INDEPENDENT_AMBULATORY_CARE_PROVIDER_SITE_OTHER): Payer: BC Managed Care – PPO | Admitting: Family Medicine

## 2022-06-24 VITALS — BP 114/77 | HR 79 | Resp 18 | Ht 66.0 in | Wt 159.0 lb

## 2022-06-24 DIAGNOSIS — Z0000 Encounter for general adult medical examination without abnormal findings: Secondary | ICD-10-CM | POA: Diagnosis not present

## 2022-06-24 DIAGNOSIS — Z1211 Encounter for screening for malignant neoplasm of colon: Secondary | ICD-10-CM

## 2022-06-24 DIAGNOSIS — Z1212 Encounter for screening for malignant neoplasm of rectum: Secondary | ICD-10-CM

## 2022-06-24 NOTE — Progress Notes (Addendum)
Complete physical exam  Patient: Janice Silva   DOB: 1975-06-05   47 y.o. Female  MRN: [id number]  Subjective:    Chief Complaint  Patient presents with   Annual Exam    Janice Silva is a 47 y.o. female who presents today for a complete physical exam. She reports consuming a general diet with an emphasis on balance and natural ingredients. Home exercise routine includes going for walks on the trails in the woods to her [place]. She generally feels well. She reports sleeping well. She does not have additional problems to discuss today.  Because of her birth control pills, but she has started experiencing some menopausal symptoms like hot flashes every once in a while and sleeping more than usual occasionally.   Most recent fall risk assessment:    06/24/2022    3:54 PM  Fall Risk   Falls in the past year? 0  Number falls in past yr: 0  Injury with Fall? 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   Healthcare maintenance 12/21/2017   Encounter for tobacco use cessation counseling 12/21/2017   Abnormal EKG 11/19/2014   Thrombocytosis 09/16/2014   Palpitations 09/16/2014    Past Surgical History:  Procedure Laterality Date   MYRINGOTOMY WITH TUBE PLACEMENT     TONSILLECTOMY AND ADENOIDECTOMY     Social History   Tobacco Use   Smoking status: Some Days    Packs/day: 0.25    Years: 25.00    Additional pack years: 0.00    Total pack years: 6.25    Types: Cigarettes    Passive exposure: Never   Smokeless tobacco: Never  Vaping Use   Vaping Use: Never used  Substance Use Topics   Alcohol use: Not Currently    Comment:  social   Drug use: No   Family History  Problem Relation Age of Onset   Lupus Mother    Healthy Father    Healthy Sister    Healthy Daughter    Healthy Daughter    Breast cancer Neg Hx    No Known Allergies   Patient Care Team: Melida Quitter, PA as PCP - General (Family Medicine) Waynard Reeds, MD as Consulting Physician (Obstetrics and Gynecology)   Outpatient Medications Prior to Visit  Medication Sig   norethindrone-ethinyl estradiol (OVCON-35) 0.4-35 MG-MCG tablet Ovcon-35 (28)   nortriptyline (PAMELOR) 10 MG capsule Take 1 capsule (10 mg total) by mouth at bedtime.   No facility-administered medications prior to visit.    Review of Systems  Constitutional:  Negative for chills, fever and malaise/fatigue.  HENT:  Negative for congestion and hearing loss.   Eyes:  Negative for blurred vision and double vision.  Respiratory:  Negative for cough and shortness of breath.   Cardiovascular:  Negative for chest pain, palpitations and leg swelling.  Gastrointestinal:  Negative for abdominal pain, constipation, diarrhea and heartburn.  Genitourinary:  Negative for frequency and urgency.  Musculoskeletal:  Negative for myalgias and neck pain.  Neurological:  Negative  for headaches.  Endo/Heme/Allergies:  Negative for polydipsia.  Psychiatric/Behavioral:  Negative for depression. The patient is not nervous/anxious.        Objective:    BP 114/77 (BP Location: Left Arm, Patient Position: Sitting, Cuff Size: Normal)   Pulse 79   Resp 18   Ht 5\' 6"  (1.676 m)   Wt 159 lb (72.1 kg)   SpO2 98%   BMI 25.66 kg/m    Physical Exam Constitutional:      General: She is not in acute distress.    Appearance: Normal appearance.  HENT:     Head: Normocephalic and atraumatic.     Right Ear: Tympanic membrane and ear canal normal.     Left Ear: Tympanic membrane and ear canal normal.     Nose: Nose normal.     Mouth/Throat:     Mouth: Mucous membranes are moist.      Pharynx: No oropharyngeal exudate or posterior oropharyngeal erythema.  Eyes:     Extraocular Movements: Extraocular movements intact.     Pupils: Pupils are equal, round, and reactive to light.  Neck:     Thyroid: No thyroid mass, thyromegaly or thyroid tenderness.  Cardiovascular:     Rate and Rhythm: Normal rate and regular rhythm.     Heart sounds: Normal heart sounds.  Pulmonary:     Effort: Pulmonary effort is normal.     Breath sounds: Normal breath sounds.  Abdominal:     General: Abdomen is flat. Bowel sounds are normal.  Musculoskeletal:        General: Normal range of motion.     Cervical back: Normal range of motion and neck supple.  Lymphadenopathy:     Cervical: No cervical adenopathy.  Skin:    General: Skin is warm and dry.  Neurological:     Mental Status: She is alert and oriented to person, place, and time.  Psychiatric:        Mood and Affect: Mood normal.       Assessment & Plan:    Routine Health Maintenance and Physical Exam  Immunization History  Administered Date(s) Administered   Influenza-Unspecified 12/15/2015, 12/23/2015, 01/13/2017, 12/14/2017, 12/29/2018    Health Maintenance  Topic Date Due   Hepatitis C Screening  Never done   DTaP/Tdap/Td (1 - Tdap) Never done   Fecal DNA (Cologuard)  Never done   COVID-19 Vaccine (1) 07/10/2022 (Originally 05/08/1976)   INFLUENZA VACCINE  10/15/2022   PAP SMEAR-Modifier  04/02/2024   HIV Screening  Completed   HPV VACCINES  Aged Out   We discussed the importance of colorectal cancer screenings and the risks and benefits of colonoscopy versus Cologuard.  She has no family history of colorectal cancer and would prefer a noninvasive option.  Sending order for Cologuard.  Discussed health benefits of physical activity, and encouraged her to engage in regular exercise appropriate for her age and condition.  Wellness examination  Screening for colorectal cancer -     Cologuard    Return in about 1  year (around 06/24/2023) for annual physical, fasting blood work 1 week before.     Melida Quitter, PA

## 2022-06-24 NOTE — Patient Instructions (Addendum)
Health maintenance: -At your convenience, go to your local pharmacy and ask for a tetanus booster -Otherwise, you are up-to-date on all preventative care.  Keep up the great work!  It was nice to meet you today!  Thank you so much for the farmers market recommendations, I am definitely checking them out.

## 2022-06-30 DIAGNOSIS — Z713 Dietary counseling and surveillance: Secondary | ICD-10-CM | POA: Diagnosis not present

## 2022-07-17 ENCOUNTER — Encounter: Payer: Self-pay | Admitting: Family Medicine

## 2022-12-28 ENCOUNTER — Telehealth: Payer: BC Managed Care – PPO | Admitting: Physician Assistant

## 2022-12-28 DIAGNOSIS — B9689 Other specified bacterial agents as the cause of diseases classified elsewhere: Secondary | ICD-10-CM | POA: Diagnosis not present

## 2022-12-28 DIAGNOSIS — J019 Acute sinusitis, unspecified: Secondary | ICD-10-CM | POA: Diagnosis not present

## 2022-12-28 MED ORDER — PREDNISONE 20 MG PO TABS
40.0000 mg | ORAL_TABLET | Freq: Every day | ORAL | 0 refills | Status: DC
Start: 2022-12-28 — End: 2023-08-03

## 2022-12-28 MED ORDER — AMOXICILLIN-POT CLAVULANATE 875-125 MG PO TABS: 1.0000 | ORAL_TABLET | Freq: Two times a day (BID) | ORAL | 0 refills | Status: DC

## 2022-12-28 NOTE — Patient Instructions (Signed)
Janice Silva, thank you for joining [name], PA-C for today's virtual visit.  While this provider is not your primary care provider (PCP), if your PCP is located in our provider database this encounter information will be shared with them immediately following your visit.   A [facility] MyChart account gives you access to today's visit and all your visits, tests, and labs performed at [facility] " click here if you don't have a [facility] MyChart account or go to [email or web address]  Consent: (Patient) Janice Silva provided verbal consent for this virtual visit at the beginning of the encounter.  Current Medications:  Current Outpatient Medications:    amoxicillin-clavulanate (AUGMENTIN) 875-125 MG tablet, Take 1 tablet by mouth 2 (two) times daily., Disp: 20 tablet, Rfl: 0   predniSONE (DELTASONE) 20 MG tablet, Take 2 tablets (40 mg total) by mouth daily with breakfast., Disp: 10 tablet, Rfl: 0   norethindrone-ethinyl estradiol (OVCON-35) 0.4-35 MG-MCG tablet, Ovcon-35 (28), Disp: , Rfl:    nortriptyline (PAMELOR) 10 MG capsule, Take 1 capsule (10 mg total) by mouth at bedtime., Disp: 90 capsule, Rfl: 1   Medications ordered in this encounter:  Meds ordered this encounter  Medications   amoxicillin-clavulanate (AUGMENTIN) 875-125 MG tablet    Sig: Take 1 tablet by mouth 2 (two) times daily.    Dispense:  20 tablet    Refill:  0    Order Specific Question:   Supervising Provider    Answer:   Merrilee Jansky [[id number]]   predniSONE (DELTASONE) 20 MG tablet    Sig: Take 2 tablets (40 mg total) by mouth daily with breakfast.    Dispense:  10 tablet    Refill:  0    Order Specific Question:   Supervising Provider    Answer:   Merrilee Jansky [[id number]]     *If you need refills on other medications prior to your next appointment, please contact your pharmacy*  Follow-Up: Call back or seek an in-person evaluation if the symptoms worsen or if the  condition fails to improve as anticipated.  [facility] [phone]  Other Instructions Sinus Infection, Adult A sinus infection, also called sinusitis, is inflammation of your sinuses. Sinuses are hollow spaces in the bones around your face. Your sinuses are located: Around your eyes. In the middle of your forehead. Behind your nose. In your cheekbones. Mucus normally drains out of your sinuses. When your nasal tissues become inflamed or swollen, mucus can become trapped or blocked. This allows bacteria, viruses, and fungi to grow, which leads to infection. Most infections of the sinuses are caused by a virus. A sinus infection can develop quickly. It can last for up to 4 weeks (acute) or for more than 12 weeks (chronic). A sinus infection often develops after a cold. What are the causes? This condition is caused by anything that creates swelling in the sinuses or stops mucus from draining. This includes: Allergies. Asthma. Infection from bacteria or viruses. Deformities or blockages in your nose or sinuses. Abnormal growths in the nose (nasal polyps). Pollutants, such as chemicals or irritants in the air. Infection from fungi. This is rare. What increases the risk? You are more likely to develop this condition if you: Have a weak body defense system (immune system). Do a lot of swimming or diving. Overuse nasal sprays. Smoke. What are the signs or symptoms? The main symptoms of this condition are pain and a feeling of pressure around the affected sinuses. Other  symptoms include: Stuffy nose or congestion that makes it difficult to breathe through your nose. Thick yellow or greenish drainage from your nose. Tenderness, swelling, and warmth over the affected sinuses. A cough that may get worse at night. Decreased sense of smell and taste. Extra mucus that collects in the throat or the back of the nose (postnasal drip) causing a sore throat or bad breath. Tiredness  (fatigue). Fever. How is this diagnosed? This condition is diagnosed based on: Your symptoms. Your medical history. A physical exam. Tests to find out if your condition is acute or chronic. This may include: Checking your nose for nasal polyps. Viewing your sinuses using a device that has a light (endoscope). Testing for allergies or bacteria. Imaging tests, such as an MRI or CT scan. In rare cases, a bone biopsy may be done to rule out more serious types of fungal sinus disease. How is this treated? Treatment for a sinus infection depends on the cause and whether your condition is chronic or acute. If caused by a virus, your symptoms should go away on their own within 10 days. You may be given medicines to relieve symptoms. They include: Medicines that shrink swollen nasal passages (decongestants). A spray that eases inflammation of the nostrils (topical intranasal corticosteroids). Rinses that help get rid of thick mucus in your nose (nasal saline washes). Medicines that treat allergies (antihistamines). Over-the-counter pain relievers. If caused by bacteria, your health care provider may recommend waiting to see if your symptoms improve. Most bacterial infections will get better without antibiotic medicine. You may be given antibiotics if you have: A severe infection. A weak immune system. If caused by narrow nasal passages or nasal polyps, surgery may be needed. Follow these instructions at home: Medicines Take, use, or apply over-the-counter and prescription medicines only as told by your health care provider. These may include nasal sprays. If you were prescribed an antibiotic medicine, take it as told by your health care provider. Do not stop taking the antibiotic even if you start to feel better. Hydrate and humidify  Drink enough fluid to keep your urine pale yellow. Staying hydrated will help to thin your mucus. Use a cool mist humidifier to keep the humidity level in your  home above 50%. Inhale steam for 10-15 minutes, 3-4 times a day, or as told by your health care provider. You can do this in the bathroom while a hot shower is running. Limit your exposure to cool or dry air. Rest Rest as much as possible. Sleep with your head raised (elevated). Make sure you get enough sleep each night. General instructions  Apply a warm, moist washcloth to your face 3-4 times a day or as told by your health care provider. This will help with discomfort. Use nasal saline washes as often as told by your health care provider. Wash your hands often with soap and water to reduce your exposure to germs. If soap and water are not available, use hand sanitizer. Do not smoke. Avoid being around people who are smoking (secondhand smoke). Keep all follow-up visits. This is important. Contact a health care provider if: You have a fever. Your symptoms get worse. Your symptoms do not improve within 10 days. Get help right away if: You have a severe headache. You have persistent vomiting. You have severe pain or swelling around your face or eyes. You have vision problems. You develop confusion. Your neck is stiff. You have trouble breathing. These symptoms may be an emergency. Get help right  away. Call 911. Do not wait to see if the symptoms will go away. Do not drive yourself to the hospital. Summary A sinus infection is soreness and inflammation of your sinuses. Sinuses are hollow spaces in the bones around your face. This condition is caused by nasal tissues that become inflamed or swollen. The swelling traps or blocks the flow of mucus. This allows bacteria, viruses, and fungi to grow, which leads to infection. If you were prescribed an antibiotic medicine, take it as told by your health care provider. Do not stop taking the antibiotic even if you start to feel better. Keep all follow-up visits. This is important. This information is not intended to replace advice given to  you by your health care provider. Make sure you discuss any questions you have with your health care provider. Document Revised: 02/04/2021 Document Reviewed: 02/04/2021 Elsevier Patient Education  2024 Elsevier Inc.    If you have been instructed to have an in-person evaluation today at a local Urgent Care facility, please use the link below. It will take you to a list of all of our available [facility], including address, phone number and hours of operation. Please do not delay care.  [facility]  If you or a family member do not have a primary care provider, use the link below to schedule a visit and establish care. When you choose a [facility] primary care physician or advanced practice provider, you gain a long-term partner in health. Find a Primary Care Provider  Learn more about [facility]'s in-office and virtual care options: [facility] - Get Care Now

## 2022-12-28 NOTE — Progress Notes (Signed)
Virtual Visit Consent   Janice Silva, you are scheduled for a virtual visit with a [facility] provider today. Just as with appointments in the office, your consent must be obtained to participate. Your consent will be active for this visit and any virtual visit you may have with one of our providers in the next 365 days. If you have a MyChart account, a copy of this consent can be sent to you electronically.  As this is a virtual visit, video technology does not allow for your provider to perform a traditional examination. This may limit your provider's ability to fully assess your condition. If your provider identifies any concerns that need to be evaluated in person or the need to arrange testing (such as labs, EKG, etc.), we will make arrangements to do so. Although advances in technology are sophisticated, we cannot ensure that it will always work on either your end or our end. If the connection with a video visit is poor, the visit may have to be switched to a telephone visit. With either a video or telephone visit, we are not always able to ensure that we have a secure connection.  By engaging in this virtual visit, you consent to the provision of healthcare and authorize for your insurance to be billed (if applicable) for the services provided during this visit. Depending on your insurance coverage, you may receive a charge related to this service.  I need to obtain your verbal consent now. Are you willing to proceed with your visit today? Jamie Hafford has provided verbal consent on 12/28/2022 for a virtual visit (video or telephone). [name], PA-C  Date: 12/28/2022 9:23 AM  Virtual Visit via Video Note   I, [name], connected with  Janice Silva  ([id number], 13-Jan-1976) on 12/28/22 at  9:15 AM EDT by a video-enabled telemedicine application and verified that I am speaking with the correct person using two identifiers.  Location: Patient: Virtual Visit Location  Patient: Home Provider: Virtual Visit Location Provider: Home Office   I discussed the limitations of evaluation and management by telemedicine and the availability of in person appointments. The patient expressed understanding and agreed to proceed.    History of Present Illness: Janice Silva is a 47 y.o. who identifies as a female who was assigned female at birth, and is being seen today for possible sinus infections.  HPI: Sinusitis This is a new problem. The current episode started in the past 7 days. The problem has been gradually worsening since onset. There has been no fever. The pain is moderate. Associated symptoms include chills (yesterday), congestion, ear pain (mild), headaches, sinus pressure and a sore throat (from mouth breathing causing dryness). Pertinent negatives include no coughing, hoarse voice or sneezing. Treatments tried: cold and flu, sudafed. The treatment provided no relief.     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   Healthcare maintenance 12/21/2017   Encounter for tobacco use cessation counseling 12/21/2017   Abnormal EKG 11/19/2014   Thrombocytosis 09/16/2014   Palpitations 09/16/2014    Allergies: No Known Allergies Medications:  Current Outpatient Medications:    amoxicillin-clavulanate (AUGMENTIN) 875-125 MG tablet, Take 1 tablet by mouth 2 (two) times daily., Disp: 20 tablet, Rfl: 0   predniSONE (DELTASONE) 20 MG tablet, Take 2 tablets (40 mg  total) by mouth daily with breakfast., Disp: 10 tablet, Rfl: 0   norethindrone-ethinyl estradiol (OVCON-35) 0.4-35 MG-MCG tablet, Ovcon-35 (28), Disp: , Rfl:    nortriptyline (PAMELOR) 10 MG capsule, Take 1 capsule (10 mg total) by mouth at bedtime., Disp: 90 capsule, Rfl: 1  Observations/Objective: Patient is well-developed,  well-nourished in no acute distress.  Resting comfortably at home.  Head is normocephalic, atraumatic.  No labored breathing.  Speech is clear and coherent with logical content.  Patient is alert and oriented at baseline.    Assessment and Plan: 1. Acute bacterial sinusitis - amoxicillin-clavulanate (AUGMENTIN) 875-125 MG tablet; Take 1 tablet by mouth 2 (two) times daily.  Dispense: 20 tablet; Refill: 0 - predniSONE (DELTASONE) 20 MG tablet; Take 2 tablets (40 mg total) by mouth daily with breakfast.  Dispense: 10 tablet; Refill: 0  - Worsening symptoms that have not responded to OTC medications.  - Will give Augmentin and Prednisone - Continue allergy medications.  - Steam and humidifier can help - Stay well hydrated and get plenty of rest.  - Seek in person evaluation if no symptom improvement or if symptoms worsen   Follow Up Instructions: I discussed the assessment and treatment plan with the patient. The patient was provided an opportunity to ask questions and all were answered. The patient agreed with the plan and demonstrated an understanding of the instructions.  A copy of instructions were sent to the patient via MyChart unless otherwise noted below.    The patient was advised to call back or seek an in-person evaluation if the symptoms worsen or if the condition fails to improve as anticipated.    [name], PA-C

## 2023-04-22 ENCOUNTER — Encounter: Payer: Self-pay | Admitting: Family Medicine

## 2023-05-04 DIAGNOSIS — Z01419 Encounter for gynecological examination (general) (routine) without abnormal findings: Secondary | ICD-10-CM | POA: Diagnosis not present

## 2023-05-04 DIAGNOSIS — Z1231 Encounter for screening mammogram for malignant neoplasm of breast: Secondary | ICD-10-CM | POA: Diagnosis not present

## 2023-05-04 LAB — HM MAMMOGRAPHY

## 2023-06-08 DIAGNOSIS — Z713 Dietary counseling and surveillance: Secondary | ICD-10-CM | POA: Diagnosis not present

## 2023-06-14 ENCOUNTER — Encounter: Payer: Self-pay | Admitting: Family Medicine

## 2023-06-22 ENCOUNTER — Other Ambulatory Visit: Payer: BC Managed Care – PPO

## 2023-06-28 ENCOUNTER — Encounter: Payer: BC Managed Care – PPO | Admitting: Family Medicine

## 2023-07-23 ENCOUNTER — Encounter: Payer: Self-pay | Admitting: Family Medicine

## 2023-08-03 ENCOUNTER — Telehealth: Admitting: Physician Assistant

## 2023-08-03 DIAGNOSIS — J0110 Acute frontal sinusitis, unspecified: Secondary | ICD-10-CM | POA: Diagnosis not present

## 2023-08-03 MED ORDER — FLUTICASONE PROPIONATE 50 MCG/ACT NA SUSP
2.0000 | Freq: Every day | NASAL | 0 refills | Status: AC
Start: 2023-08-03 — End: ?

## 2023-08-03 MED ORDER — AMOXICILLIN-POT CLAVULANATE 875-125 MG PO TABS
1.0000 | ORAL_TABLET | Freq: Two times a day (BID) | ORAL | 0 refills | Status: DC
Start: 2023-08-03 — End: 2023-08-13

## 2023-08-03 NOTE — Progress Notes (Signed)
 Virtual Visit Consent   Janice Silva, you are scheduled for a virtual visit with a [facility] provider today. Just as with appointments in the office, your consent must be obtained to participate. Your consent will be active for this visit and any virtual visit you may have with one of our providers in the next 365 days. If you have a MyChart account, a copy of this consent can be sent to you electronically.  As this is a virtual visit, video technology does not allow for your provider to perform a traditional examination. This may limit your provider's ability to fully assess your condition. If your provider identifies any concerns that need to be evaluated in person or the need to arrange testing (such as labs, EKG, etc.), we will make arrangements to do so. Although advances in technology are sophisticated, we cannot ensure that it will always work on either your end or our end. If the connection with a video visit is poor, the visit may have to be switched to a telephone visit. With either a video or telephone visit, we are not always able to ensure that we have a secure connection.  By engaging in this virtual visit, you consent to the provision of healthcare and authorize for your insurance to be billed (if applicable) for the services provided during this visit. Depending on your insurance coverage, you may receive a charge related to this service.  I need to obtain your verbal consent now. Are you willing to proceed with your visit today? Janice Silva has provided verbal consent on 08/03/2023 for a virtual visit (video or telephone). Hyla Maillard, [place]  Date: 08/03/2023 4:59 PM   Virtual Visit via Video Note   I, Hyla Maillard, connected with  Janice Silva  ([id number], 1976-01-22) on 08/03/23 at  5:00 PM EDT by a video-enabled telemedicine application and verified that I am speaking with the correct person using two identifiers.  Location: Patient: Virtual Visit Location  Patient: Home Provider: Virtual Visit Location Provider: Home Office   I discussed the limitations of evaluation and management by telemedicine and the availability of in person appointments. The patient expressed understanding and agreed to proceed.    History of Present Illness: Janice Silva is a 48 y.o. who identifies as a female who was assigned female at birth, and is being seen today for sinus symptoms starting over the past several days with nasal congestion, sinus pressure and ear pressure. Started some OTC cold medication which has only helped slightly.  Now with maxillary sinus pain. Denies chest pain or SOB. Denies fever. Denies recent travel or known sick contact.   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   Healthcare maintenance 12/21/2017   Encounter for tobacco use cessation counseling 12/21/2017   Abnormal EKG 11/19/2014   Thrombocytosis 09/16/2014   Palpitations 09/16/2014    Allergies: No Known Allergies Medications:  Current Outpatient Medications:    amoxicillin -clavulanate (AUGMENTIN ) 875-125 MG tablet, Take 1 tablet by mouth 2 (two) times daily., Disp: 14 tablet, Rfl: 0   fluticasone  (FLONASE ) 50 MCG/ACT nasal spray, Place 2 sprays into both nostrils daily., Disp: 16 g, Rfl: 0   norethindrone-ethinyl estradiol (OVCON-35) 0.4-35 MG-MCG tablet, Ovcon-35 (28), Disp: , Rfl:   Observations/Objective: Patient is well-developed, well-nourished in no  acute distress.  Resting comfortably at home.  Head is normocephalic, atraumatic.  No labored breathing.  Speech is clear and coherent with logical content.  Patient is alert and oriented at baseline.   Assessment and Plan: 1. Acute non-recurrent frontal sinusitis (Primary) - fluticasone  (FLONASE ) 50 MCG/ACT nasal spray; Place  2 sprays into both nostrils daily.  Dispense: 16 g; Refill: 0 - amoxicillin -clavulanate (AUGMENTIN ) 875-125 MG tablet; Take 1 tablet by mouth 2 (two) times daily.  Dispense: 14 tablet; Refill: 0  Rx Augmentin .  Increase fluids.  Rest.  Saline nasal spray.  Probiotic.  Mucinex  as directed.  Humidifier in bedroom. Flonase  per orders.  Call or return to clinic if symptoms are not improving.   Follow Up Instructions: I discussed the assessment and treatment plan with the patient. The patient was provided an opportunity to ask questions and all were answered. The patient agreed with the plan and demonstrated an understanding of the instructions.  A copy of instructions were sent to the patient via MyChart unless otherwise noted below.   The patient was advised to call back or seek an in-person evaluation if the symptoms worsen or if the condition fails to improve as anticipated.    Hyla Maillard, PA-C

## 2023-08-03 NOTE — Patient Instructions (Signed)
 Janice Silva, thank you for joining Hyla Maillard, PA-C for today's virtual visit.  While this provider is not your primary care provider (PCP), if your PCP is located in our provider database this encounter information will be shared with them immediately following your visit.   A [facility] MyChart account gives you access to today's visit and all your visits, tests, and labs performed at [facility] " click here if you don't have a [facility] MyChart account or go to [email or web address]  Consent: (Patient) Janice Silva provided verbal consent for this virtual visit at the beginning of the encounter.  Current Medications:  Current Outpatient Medications:    amoxicillin -clavulanate (AUGMENTIN ) 875-125 MG tablet, Take 1 tablet by mouth 2 (two) times daily., Disp: 20 tablet, Rfl: 0   norethindrone-ethinyl estradiol (OVCON-35) 0.4-35 MG-MCG tablet, Ovcon-35 (28), Disp: , Rfl:    nortriptyline  (PAMELOR ) 10 MG capsule, Take 1 capsule (10 mg total) by mouth at bedtime., Disp: 90 capsule, Rfl: 1   predniSONE  (DELTASONE ) 20 MG tablet, Take 2 tablets (40 mg total) by mouth daily with breakfast., Disp: 10 tablet, Rfl: 0   Medications ordered in this encounter:  No orders of the defined types were placed in this encounter.    *If you need refills on other medications prior to your next appointment, please contact your pharmacy*  Follow-Up: Call back or seek an in-person evaluation if the symptoms worsen or if the condition fails to improve as anticipated.  [facility] [phone]  Other Instructions Please take antibiotic as directed.  Increase fluid intake.  Use Saline nasal spray.  Take a daily multivitamin. Start the Flonase  as directed.  Place a humidifier in the bedroom.  Please call or return clinic if symptoms are not improving.  Sinusitis Sinusitis is redness, soreness, and swelling (inflammation) of the paranasal sinuses. Paranasal sinuses  are air pockets within the bones of your face (beneath the eyes, the middle of the forehead, or above the eyes). In healthy paranasal sinuses, mucus is able to drain out, and air is able to circulate through them by way of your nose. However, when your paranasal sinuses are inflamed, mucus and air can become trapped. This can allow bacteria and other germs to grow and cause infection. Sinusitis can develop quickly and last only a short time (acute) or continue over a long period (chronic). Sinusitis that lasts for more than 12 weeks is considered chronic.  CAUSES  Causes of sinusitis include: Allergies. Structural abnormalities, such as displacement of the cartilage that separates your nostrils (deviated septum), which can decrease the air flow through your nose and sinuses and affect sinus drainage. Functional abnormalities, such as when the small hairs (cilia) that line your sinuses and help remove mucus do not work properly or are not present. SYMPTOMS  Symptoms of acute and chronic sinusitis are the same. The primary symptoms are pain and pressure around the affected sinuses. Other symptoms include: Upper toothache. Earache. Headache. Bad breath. Decreased sense of smell and taste. A cough, which worsens when you are lying flat. Fatigue. Fever. Thick drainage from your nose, which often is green and may contain pus (purulent). Swelling and warmth over the affected sinuses. DIAGNOSIS  Your caregiver will perform a physical exam. During the exam, your caregiver may: Look in your nose for signs of abnormal growths in your nostrils (nasal polyps). Tap over the affected sinus to check for signs of infection. View the inside of your sinuses (endoscopy) with a special imaging  device with a light attached (endoscope), which is inserted into your sinuses. If your caregiver suspects that you have chronic sinusitis, one or more of the following tests may be recommended: Allergy tests. Nasal  culture A sample of mucus is taken from your nose and sent to a lab and screened for bacteria. Nasal cytology A sample of mucus is taken from your nose and examined by your caregiver to determine if your sinusitis is related to an allergy. TREATMENT  Most cases of acute sinusitis are related to a viral infection and will resolve on their own within 10 days. Sometimes medicines are prescribed to help relieve symptoms (pain medicine, decongestants, nasal steroid sprays, or saline sprays).  However, for sinusitis related to a bacterial infection, your caregiver will prescribe antibiotic medicines. These are medicines that will help kill the bacteria causing the infection.  Rarely, sinusitis is caused by a fungal infection. In theses cases, your caregiver will prescribe antifungal medicine. For some cases of chronic sinusitis, surgery is needed. Generally, these are cases in which sinusitis recurs more than 3 times per year, despite other treatments. HOME CARE INSTRUCTIONS  Drink plenty of water. Water helps thin the mucus so your sinuses can drain more easily. Use a humidifier. Inhale steam 3 to 4 times a day (for example, sit in the bathroom with the shower running). Apply a warm, moist washcloth to your face 3 to 4 times a day, or as directed by your caregiver. Use saline nasal sprays to help moisten and clean your sinuses. Take over-the-counter or prescription medicines for pain, discomfort, or fever only as directed by your caregiver. SEEK IMMEDIATE MEDICAL CARE IF: You have increasing pain or severe headaches. You have nausea, vomiting, or drowsiness. You have swelling around your face. You have vision problems. You have a stiff neck. You have difficulty breathing. MAKE SURE YOU:  Understand these instructions. Will watch your condition. Will get help right away if you are not doing well or get worse. Document Released: 03/02/2005 Document Revised: 05/25/2011 Document Reviewed:  03/17/2011 [facility] Patient Information 2014 [place], [place].    If you have been instructed to have an in-person evaluation today at a local Urgent Care facility, please use the link below. It will take you to a list of all of our available [facility], including address, phone number and hours of operation. Please do not delay care.  [facility]  If you or a family member do not have a primary care provider, use the link below to schedule a visit and establish care. When you choose a [facility] primary care physician or advanced practice provider, you gain a long-term partner in health. Find a Primary Care Provider  Learn more about [facility]'s in-office and virtual care options:  - Get Care Now

## 2023-08-13 ENCOUNTER — Ambulatory Visit
Admission: RE | Admit: 2023-08-13 | Discharge: 2023-08-13 | Disposition: A | Discharge status: Home / Self Care | Source: Ambulatory Visit | Attending: Physician Assistant | Admitting: Physician Assistant

## 2023-08-13 VITALS — BP 113/77 | HR 98 | Temp 98.1°F | Resp 18 | Wt 159.0 lb

## 2023-08-13 DIAGNOSIS — H6121 Impacted cerumen, right ear: Secondary | ICD-10-CM | POA: Diagnosis not present

## 2023-08-13 DIAGNOSIS — J029 Acute pharyngitis, unspecified: Secondary | ICD-10-CM | POA: Diagnosis not present

## 2023-08-13 DIAGNOSIS — H9201 Otalgia, right ear: Secondary | ICD-10-CM | POA: Diagnosis not present

## 2023-08-13 LAB — POCT RAPID STREP A (OFFICE): Rapid Strep A Screen: NEGATIVE

## 2023-08-13 NOTE — ED Triage Notes (Signed)
 Right side of throat is sore to swallow and right ear hurts x 1 day. Possible ear infection or drainage of some kind? - Entered by patient

## 2023-08-13 NOTE — ED Provider Notes (Signed)
 This resolved following ear [facility]    CSN: [id number] Arrival date & time: 08/13/23  0946      History   Chief Complaint Chief Complaint  Patient presents with   Sore Throat    Right side of throat is sore to swallow and right ear hurts. Possible ear infection or drainage of some kind? - Entered by patient   Otalgia    HPI Janice Silva is a 48 y.o. female.   Patient presents today with a 2-day history of right otalgia and right-sided sore throat.  She reports that yesterday her pain was significant but today does feel better.  Currently pain is rated 2 on a 0-10 pain scale, localized to her right ear with radiation into her right throat, described as tenderness, no alleviating factors identified.  She has not been taking any over-the-counter medications for symptom management.  She was recently treated for acute sinusitis through telemedicine appointment on 08/03/2023 and completed 7 days of Augmentin .  She reports that her congestion improved but never completely resolved.  Her last dose was approximately 2 days ago.  She denies additional antibiotics or steroids in the past 90 days.  Denies any significant past medical history including seasonal allergies, asthma, COPD, diabetes.  She has no concern for pregnancy.    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      Home Medications    Prior to Admission medications   Medication Sig Start Date End Date Taking? Authorizing Provider  clobetasol ointment (TEMOVATE) 0.05 % APPLY A THIN LAYER TO THE AFFECTED AREA(S) BY TOPICAL ROUTE 2 TIMES PER DAY 01/15/23  Yes [provider]  norethindrone (MICRONOR) 0.35 MG tablet Take 1 tablet by mouth daily. 07/16/23  Yes [provider]  fluticasone  (FLONASE ) 50 MCG/ACT nasal spray Place 2 sprays into both nostrils daily. 08/03/23   Farris Hong, PA-C  norethindrone-ethinyl estradiol (OVCON-35) 0.4-35 MG-MCG tablet Ovcon-35 (28)    [provider]    Family History Family History  Problem Relation Age of Onset   Lupus Mother    Healthy Father    Healthy Sister    Healthy Daughter    Healthy Daughter    Breast cancer Neg Hx     Social History Social History   Tobacco Use   Smoking status: Some Days    Current packs/day: 0.25    Average packs/day: 0.3 packs/day for 25.0 years (6.3 ttl pk-yrs)    Types: Cigarettes    Passive exposure: Current   Smokeless tobacco: Never  Vaping Use   Vaping status: Never Used  Substance  Use Topics   Alcohol use: Not Currently    Comment: social   Drug use: No     Allergies   Patient has no known allergies.   Review of Systems Review of Systems  Constitutional:  Positive for activity change. Negative for appetite change, fatigue and fever.  HENT:  Positive for congestion (Improved), ear pain and sore throat. Negative for sinus pressure and sneezing.   Respiratory:  Positive for cough (Infrequent). Negative for shortness of breath.   Cardiovascular:  Negative for chest pain.  Gastrointestinal:  Negative for abdominal pain, diarrhea, nausea and vomiting.     Physical Exam Triage Vital Signs ED Triage Vitals  Encounter Vitals Group      BP 08/13/23 1008 113/77     Systolic BP Percentile --      Diastolic BP Percentile --      Pulse Rate 08/13/23 1008 (!) 102     Resp 08/13/23 1008 18     Temp 08/13/23 1008 98.1 F (36.7 C)     Temp Source 08/13/23 1008 Oral     SpO2 08/13/23 1008 98 %     Weight 08/13/23 1007 158 lb 15.2 oz (72.1 kg)     Height --      Head Circumference --      Peak Flow --      Pain Score 08/13/23 1006 2     Pain Loc --      Pain Education --      Exclude from Growth Chart --    No data found.  Updated Vital Signs BP 113/77 (BP Location: Left Arm)   Pulse 98   Temp 98.1 F (36.7 C) (Oral)   Resp 18   Wt 158 lb 15.2 oz (72.1 kg)   LMP 08/01/2023 (Exact Date)   SpO2 96%   BMI 25.66 kg/m   Visual Acuity Right Eye Distance:   Left Eye Distance:   Bilateral Distance:    Right Eye Near:   Left Eye Near:    Bilateral Near:     Physical Exam Vitals reviewed.  Constitutional:      General: She is awake. She is not in acute distress.    Appearance: Normal appearance. She is well-developed. She is not ill-appearing.     Comments: Very pleasant female appears stated age in no acute distress sitting comfortably in exam room  HENT:     Head: Normocephalic and atraumatic.     Right Ear: External ear normal. There is impacted cerumen.     Left Ear: Tympanic membrane, ear canal and external ear normal. Tympanic membrane is not erythematous or bulging.     Ears:     Comments: Right ear: Cerumen impaction noted and unable to visualize TM.  Cerumen impaction resolved following in office irrigation revealing normal TM and external auditory canal.    Nose: Nose normal.     Mouth/Throat:     Pharynx: Uvula midline. Posterior oropharyngeal erythema and postnasal drip present. No oropharyngeal exudate.  Cardiovascular:     Rate and Rhythm: Normal rate and regular rhythm.     Heart sounds: Normal heart sounds, S1 normal and S2 normal. No murmur heard. Pulmonary:     Effort: Pulmonary effort is  normal.     Breath sounds: Normal breath sounds. No wheezing, rhonchi or rales.     Comments: Clear to auscultation bilaterally Lymphadenopathy:     Head:     Right side of head: No submental, submandibular or tonsillar adenopathy.  Left side of head: No submental, submandibular or tonsillar adenopathy.     Cervical: No cervical adenopathy.  Psychiatric:        Behavior: Behavior is cooperative.      UC Treatments / Results  Labs (all labs ordered are listed, but only abnormal results are displayed) Labs Reviewed  POCT RAPID STREP A (OFFICE) - Normal  CULTURE, GROUP A STREP [facility])    EKG   Radiology No results found.  Procedures Procedures (including critical care time)  Medications Ordered in UC Medications - No data to display  Initial Impression / Assessment and Plan / UC Course  I have reviewed the triage vital signs and the nursing notes.  Pertinent labs & imaging results that were available during my care of the patient were reviewed by me and considered in my medical decision making (see chart for details).     Patient is well-appearing, afebrile, nontoxic, nontachycardic.  She was initially mildly tachycardic but this resolved after she sat quietly for several minutes.  Strep testing was obtained that was negative.  Will send this for culture but defer antibiotics until culture results are available.  Cerumen impaction was noted on exam but this resolved following in office irrigation revealing normal TM with no evidence of acute infection.  We discussed that her symptoms are likely related to postnasal drainage from previous infection.  Recommended symptomatic treatment including gargling with warm salt water and over-the-counter analgesics.  We did discuss potential utility of viscous lidocaine, ever, patient has already had significant improvement of symptoms and so we will defer this medication for the time being.  We discussed that if she has any worsening or  changing symptoms she needs to be seen immediately.  Strict return precautions given.  Patient declined excuse note.  Final Clinical Impressions(s) / UC Diagnoses   Final diagnoses:  Sore throat  Impacted cerumen of right ear  Acute otalgia, right     Discharge Instructions      You were negative for strep.  We will contact you if your culture is positive and we need to start additional treatment.  I suspect your symptoms are related to drainage from your previous infection.  I do not see reason to start antibiotics today.  Gargle with warm salt water and use Tylenol ibuprofen over-the-counter.  We were able to remove the wax from your ear as hopefully this will also help with your symptoms.  If you have any worsening or changing symptoms including increasing pain, swelling of your throat, shortness of breath, fever, nausea, vomiting, drainage from the ear you should be seen immediately.   ED Prescriptions   None    PDMP not reviewed this encounter.   Budd Cargo, PA-C 08/13/23 1110

## 2023-08-13 NOTE — Discharge Instructions (Signed)
 You were negative for strep.  We will contact you if your culture is positive and we need to start additional treatment.  I suspect your symptoms are related to drainage from your previous infection.  I do not see reason to start antibiotics today.  Gargle with warm salt water and use Tylenol ibuprofen over-the-counter.  We were able to remove the wax from your ear as hopefully this will also help with your symptoms.  If you have any worsening or changing symptoms including increasing pain, swelling of your throat, shortness of breath, fever, nausea, vomiting, drainage from the ear you should be seen immediately.

## 2023-08-15 LAB — CULTURE, GROUP A STREP (THRC)

## 2023-08-16 ENCOUNTER — Ambulatory Visit (HOSPITAL_COMMUNITY): Payer: Self-pay

## 2023-08-28 ENCOUNTER — Telehealth: Admitting: Family

## 2023-08-28 DIAGNOSIS — B372 Candidiasis of skin and nail: Secondary | ICD-10-CM | POA: Diagnosis not present

## 2023-08-28 DIAGNOSIS — K130 Diseases of lips: Secondary | ICD-10-CM

## 2023-08-28 MED ORDER — NYSTATIN 100000 UNIT/GM EX CREA
1.0000 | TOPICAL_CREAM | Freq: Two times a day (BID) | CUTANEOUS | 2 refills | Status: DC
Start: 2023-08-28 — End: 2023-09-27

## 2023-08-28 MED ORDER — NYSTATIN 100000 UNIT/GM EX POWD: 1.0000 | Freq: Three times a day (TID) | CUTANEOUS | 1 refills | Status: DC

## 2023-08-28 NOTE — Progress Notes (Signed)
 Virtual Visit Consent   Janice Silva, you are scheduled for a virtual visit with a [facility] provider today. Just as with appointments in the office, your consent must be obtained to participate. Your consent will be active for this visit and any virtual visit you may have with one of our providers in the next 365 days. If you have a MyChart account, a copy of this consent can be sent to you electronically.  As this is a virtual visit, video technology does not allow for your provider to perform a traditional examination. This may limit your provider's ability to fully assess your condition. If your provider identifies any concerns that need to be evaluated in person or the need to arrange testing (such as labs, EKG, etc.), we will make arrangements to do so. Although advances in technology are sophisticated, we cannot ensure that it will always work on either your end or our end. If the connection with a video visit is poor, the visit may have to be switched to a telephone visit. With either a video or telephone visit, we are not always able to ensure that we have a secure connection.  By engaging in this virtual visit, you consent to the provision of healthcare and authorize for your insurance to be billed (if applicable) for the services provided during this visit. Depending on your insurance coverage, you may receive a charge related to this service.  I need to obtain your verbal consent now. Are you willing to proceed with your visit today? Alisa Stjames has provided verbal consent on 08/28/2023 for a virtual visit (video or telephone). Tommas Fragmin, FNP  Date: 08/28/2023 9:15 AM   Virtual Visit via Video Note   I, Tommas Fragmin, connected with  Janice Silva  ([id number], Nov 25, 1975) on 08/28/23 at  9:15 AM EDT by a video-enabled telemedicine application and verified that I am speaking with the correct person using two identifiers.  Location: Patient: Virtual Visit Location Patient:  Home Provider: Virtual Visit Location Provider: Home Office   I discussed the limitations of evaluation and management by telemedicine and the availability of in person appointments. The patient expressed understanding and agreed to proceed.    History of Present Illness: Janice Silva is a 48 y.o. who identifies as a female who was assigned female at birth, and is being seen today for lip dry, cracking, and erythemas.  HPI: Rash This is a new problem. The current episode started in the past 7 days. The problem has been gradually worsening since onset. Location: lips and right axilla.    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   Healthcare maintenance 12/21/2017   Encounter for tobacco use cessation counseling 12/21/2017   Abnormal EKG 11/19/2014   Thrombocytosis 09/16/2014   Palpitations 09/16/2014    Allergies: No Known Allergies Medications:  Current Outpatient Medications:    nystatin (MYCOSTATIN/NYSTOP) powder, Apply 1 Application topically 3 (three) times daily., Disp: 45 g, Rfl: 1   nystatin cream (MYCOSTATIN), Apply 1 Application topically 2 (two) times daily., Disp: 60 g, Rfl: 2   clobetasol ointment (TEMOVATE) 0.05 %, APPLY A THIN LAYER TO THE AFFECTED AREA(S) BY TOPICAL ROUTE 2 TIMES PER DAY, Disp: , Rfl:    fluticasone  (FLONASE ) 50 MCG/ACT nasal spray, Place 2 sprays into both nostrils daily., Disp:  16 g, Rfl: 0   norethindrone (MICRONOR) 0.35 MG tablet, Take 1 tablet by mouth daily., Disp: , Rfl:    norethindrone-ethinyl estradiol (OVCON-35) 0.4-35 MG-MCG tablet, Ovcon-35 (28), Disp: , Rfl:   Observations/Objective: Patient is well-developed, well-nourished in no acute distress.  Resting comfortably  at home.  Head is normocephalic, atraumatic.  No labored breathing.  Speech is clear  and coherent with logical content.  Patient is alert and oriented at baseline.  Bilateral lips erythemas, dry and cracked, corner of mouth erythemas and cracked  Right axilla erythemas   Assessment and Plan: 1. Angular cheilitis (Primary) - nystatin cream (MYCOSTATIN); Apply 1 Application topically 2 (two) times daily.  Dispense: 60 g; Refill: 2 - nystatin (MYCOSTATIN/NYSTOP) powder; Apply 1 Application topically 3 (three) times daily.  Dispense: 45 g; Refill: 1  2. Candidal skin infection - nystatin cream (MYCOSTATIN); Apply 1 Application topically 2 (two) times daily.  Dispense: 60 g; Refill: 2 - nystatin (MYCOSTATIN/NYSTOP) powder; Apply 1 Application topically 3 (three) times daily.  Dispense: 45 g; Refill: 1  Keep clean and dry Use Vaseline on lips BID Avoid salty or spicy foods Follow up if symptoms worsen or do not improve   Follow Up Instructions: I discussed the assessment and treatment plan with the patient. The patient was provided an opportunity to ask questions and all were answered. The patient agreed with the plan and demonstrated an understanding of the instructions.  A copy of instructions were sent to the patient via MyChart unless otherwise noted below.    The patient was advised to call back or seek an in-person evaluation if the symptoms worsen or if the condition fails to improve as anticipated.    Tommas Fragmin, FNP

## 2023-08-31 ENCOUNTER — Ambulatory Visit

## 2023-08-31 VITALS — Ht 66.0 in

## 2023-08-31 DIAGNOSIS — K130 Diseases of lips: Secondary | ICD-10-CM | POA: Insufficient documentation

## 2023-08-31 DIAGNOSIS — L739 Follicular disorder, unspecified: Secondary | ICD-10-CM | POA: Insufficient documentation

## 2023-08-31 MED ORDER — MUPIROCIN 2 % EX OINT: 1.0000 | TOPICAL_OINTMENT | Freq: Two times a day (BID) | CUTANEOUS | 0 refills | Status: DC

## 2023-08-31 NOTE — Patient Instructions (Signed)
 It was nice to see you today!  As we discussed in clinic:  -Continue the nystatin ointment as prescribed for the rash around her mouth. - I have sent in topical mupirocin  ointment to treat the folliculitis on your chin and underarms.  Apply this to the area twice daily for 7 days. - I also recommend getting 1% hydrocortisone cream over-the-counter and mixing it with the mupirocin  to apply under your underarms to reduce the inflammation.  Make sure you are not applying the hydrocortisone to your face as it can cause skin thinning. - If your symptoms worsen or fail to improve with mupirocin , please send me a MyChart message or call the office to schedule a follow-up appointment.  It was nice to meet you! I hope you feel better soon!  If you have any problems before your next visit feel free to message me via MyChart (minor issues or questions) or call the office, otherwise you may reach out to schedule an office visit.  Thank you! Meryl Acosta, PA-C

## 2023-08-31 NOTE — Assessment & Plan Note (Signed)
 Discussed with patient that the rash under both of her arms not likely to be fungal in nature given that it is not responding to nystatin, becoming more irritated over time.  Given patient's reported history of recently starting menopause and having increased sweating and hot flashes, it is more likely that the rash is follicular in nature.  Advised patient to discontinue nystatin powder.  Have prescribed mupirocin  2% ointment to apply to bilateral armpits twice daily for 7 days.  Also recommended mixing this medication with an over-the-counter 1% hydrocortisone cream to reduce inflammation.  Advised patient that if rash worsens or does not improve after 7 days of treatment, to send a MyChart message or call and schedule a follow-up.  Also given the appearance of the red vesicular bumps on her chin, it likely a follicular reaction to the nystatin.  Advised her to continue using the nystatin ointment around the mouth as it has proven to be effective for her angular cheilitis.  However, advised her to avoid spreading the medication down onto the chin.  Also advised her to use the mupirocin  ointment on the area of folliculitis twice a day for 7 days.

## 2023-08-31 NOTE — Assessment & Plan Note (Signed)
 Improved.  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

## 2023-08-31 NOTE — Progress Notes (Signed)
 Established Patient Office Visit  Subjective   Patient ID: Janice Silva, female    DOB: 08-15-1975  Age: 48 y.o. MRN: [id number]  Chief Complaint  Patient presents with   Rash    Onset: last Tuesday(08/24/23) Meds taken: Nystatin cream and powder    HPI  Rubee Vega is a 48 year old female who presents to the clinic for follow-up of a rash around her mouth and under her arms.  Patient was seen via video visit on 08/28/2023 and was diagnosed with angular cheilitis and candidal skin infection of the armpits.  She was prescribed nystatin ointment to use around her mouth and nystatin powder for her underarms.  Patient presents to the clinic today stating that the rash under her arms is worse, more irritated, carrying more heat.  Denies itching.  Rates the discomfort 6 out of 10.  Also reports that due to sweating under her arms, the powder does not last very long after application.  She does report that the rash around her mouth has greatly improved with the nystatin ointment, however a new rash has developed on her chin that resembles red bumps.  Of note, patient reports that she believes she recently started menopause about 2 months ago and has been experiencing severe sweating and frequent hot flashes.  Denies fevers, recent travel, sick contacts, contact with other people who have a rash.    ROS Per HPI.    Objective:     Ht 5' 6 (1.676 m)   LMP 08/01/2023 (Exact Date)   BMI 25.66 kg/m    Physical Exam Constitutional:      General: She is not in acute distress.    Appearance: Normal appearance.   Cardiovascular:     Rate and Rhythm: Normal rate and regular rhythm.     Heart sounds: Normal heart sounds. No murmur heard.    No friction rub. No gallop.  Pulmonary:     Effort: Pulmonary effort is normal. No respiratory distress.     Breath sounds: Normal breath sounds.   Musculoskeletal:        General: No swelling.   Skin:    General: Skin is warm and dry.      Comments: Red, chapped annular dilation noted to corners of lips. Red, small, fluid-filled vesicles scattered on inferior chin. Widespread, red, small, fluid-filled vesicles scattered in bilateral armpits.    Neurological:     General: No focal deficit present.     Mental Status: She is alert.   Psychiatric:        Mood and Affect: Mood normal.        Behavior: Behavior normal.        Thought Content: Thought content normal.      No results found for any visits on 08/31/23.    The 10-year ASCVD risk score (Arnett DK, et al., 2019) is: 2.1%    Assessment & Plan:   Folliculitis Assessment & Plan: Discussed with patient that the rash under both of her arms not likely to be fungal in nature given that it is not responding to nystatin, becoming more irritated over time.  Given patient's reported history of recently starting menopause and having increased sweating and hot flashes, it is more likely that the rash is follicular in nature.  Advised patient to discontinue nystatin powder.  Have prescribed mupirocin  2% ointment to apply to bilateral armpits twice daily for 7 days.  Also recommended mixing this medication with an over-the-counter 1% hydrocortisone cream to reduce inflammation.  Advised patient that if rash worsens or does not improve after 7 days of treatment, to send a MyChart message or call and schedule a follow-up.  Also given the appearance of the red vesicular bumps on her chin, it likely a follicular reaction to the nystatin.  Advised her to continue using the nystatin ointment around the mouth as it has proven to be effective for her angular cheilitis.  However, advised her to avoid spreading the medication down onto the chin.  Also advised her to use the mupirocin  ointment on the area of folliculitis twice a day for 7 days.   Angular cheilitis Assessment & Plan: Improved.  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   Other orders -     Mupirocin ;  Apply 1 Application topically 2 (two) times daily. For 7 days  Dispense: 22 g; Refill: 0    Return if symptoms worsen or fail to improve.    Odilia Bennett, PA-C

## 2023-09-07 ENCOUNTER — Other Ambulatory Visit

## 2023-09-13 ENCOUNTER — Other Ambulatory Visit: Payer: Self-pay | Admitting: *Deleted

## 2023-09-13 DIAGNOSIS — Z1329 Encounter for screening for other suspected endocrine disorder: Secondary | ICD-10-CM

## 2023-09-13 DIAGNOSIS — E875 Hyperkalemia: Secondary | ICD-10-CM

## 2023-09-13 DIAGNOSIS — Z0000 Encounter for general adult medical examination without abnormal findings: Secondary | ICD-10-CM

## 2023-09-13 DIAGNOSIS — Z130 Encounter for screening for diseases of the blood and blood-forming organs and certain disorders involving the immune mechanism: Secondary | ICD-10-CM

## 2023-09-13 DIAGNOSIS — Z1159 Encounter for screening for other viral diseases: Secondary | ICD-10-CM

## 2023-09-14 ENCOUNTER — Other Ambulatory Visit

## 2023-09-14 ENCOUNTER — Encounter

## 2023-09-15 ENCOUNTER — Other Ambulatory Visit

## 2023-09-15 DIAGNOSIS — Z1159 Encounter for screening for other viral diseases: Secondary | ICD-10-CM | POA: Diagnosis not present

## 2023-09-16 LAB — HEPATITIS C ANTIBODY: Hep C Virus Ab: NONREACTIVE

## 2023-09-27 ENCOUNTER — Ambulatory Visit (INDEPENDENT_AMBULATORY_CARE_PROVIDER_SITE_OTHER)

## 2023-09-27 VITALS — BP 107/72 | HR 77 | Temp 97.7°F | Ht 66.0 in | Wt 160.0 lb

## 2023-09-27 DIAGNOSIS — Z0000 Encounter for general adult medical examination without abnormal findings: Secondary | ICD-10-CM

## 2023-09-27 DIAGNOSIS — Z1211 Encounter for screening for malignant neoplasm of colon: Secondary | ICD-10-CM | POA: Diagnosis not present

## 2023-09-27 DIAGNOSIS — Z716 Tobacco abuse counseling: Secondary | ICD-10-CM

## 2023-09-27 DIAGNOSIS — L739 Follicular disorder, unspecified: Secondary | ICD-10-CM

## 2023-09-27 MED ORDER — MUPIROCIN 2 % EX OINT: 1.0000 | TOPICAL_OINTMENT | Freq: Two times a day (BID) | CUTANEOUS | 0 refills | Status: DC

## 2023-09-27 NOTE — Patient Instructions (Addendum)
  It was nice to see you today!  As we discussed in clinic:  -I have placed the order for your Cologuard screening today. -Continue all of your current medications as prescribed. If the night sweats become too bothersome, please let me know so we can discuss treatment options.  -Keep up the great work with reducing your smoking! If you decide you want to try nicotine replacement or other medications to assist with quitting, let me know!  -We will get that fasting blood work from you in the next month or 2 and I'll follow up with you via MyChart.  -I also refilled your Mupirocin  ointment to have on hand!   -It was good to see you again!  If you have any problems before your next visit feel free to message me via MyChart (minor issues or questions) or call the office, otherwise you may reach out to schedule an office visit.  Thank you! Saddie Sacks, PA-C

## 2023-09-27 NOTE — Assessment & Plan Note (Signed)
 Resolved and stable. Will reoccur at times when she has a lot of hot flashes. Requesting refill on her mupirocin  ointment to have on hand as she used a lot of it to cover the surface area of her armpits when we were treating the initial infection.

## 2023-09-27 NOTE — Assessment & Plan Note (Signed)
 Patient will make follow up appointment for fasting labs. Will follow up w/ patient regarding results. Encouraged her to continue exercising and eating a well-balanced diet to support healthy lifestyle. Cologuard order placed today. Mammogram and Pap UTD through [facility] (will make an attempt to request records). Counseled on smoking cessation. Vaccines declined today. Will plan for next CPE in 1 year, sooner PRN. Patient verbalized understanding and was in agreement with the plan.

## 2023-09-27 NOTE — Progress Notes (Signed)
 Complete physical exam  Patient: Janice Silva   DOB: [birth year]   48 y.o. Female  MRN: [id number]  Subjective:    Chief Complaint  Patient presents with   Annual Exam    Physical     Janice Silva is a 48 y.o. female who presents today for a complete physical exam. She reports consuming a general diet. She exercises as tolerated. She generally feels well. She reports sleeping well. Reports sleep is sometimes interrupted by night sweats/hot flashes. Reports regular bowel movements. She is still smoking but has reduced her use to 4-5 cigarettes per day. She does not have additional problems to discuss today.   Of note, I saw the patient about 1 month ago for angular cheilitis/intertrigo of the armpit superimposed by bacterial folliculitis. I treated her with mupirocin  which she has been using twice daily. Reports that the rash has bumps have almost completely resolved. Still using it occasionally for redness at the corner of her mouth so she is requesting a refill of the medication.   Most recent fall risk assessment:    06/24/2022    3:54 PM  Fall Risk   Falls in the past year? 0  Number falls in past yr: 0  Injury with Fall? 0  Risk for fall due to : No Fall Risks     Most recent depression screenings:    06/24/2022    3:53 PM 03/05/2021    8:16 AM  PHQ 2/9 Scores  PHQ - 2 Score 0 0  PHQ- 9 Score 0 0    Vision:Within last year and Dental: No current dental problems and Receives regular dental care    Patient Care Team: Gayle Saddie JULIANNA DEVONNA as PCP - General (Physician Assistant) Okey Leader, MD as Consulting Physician (Obstetrics and Gynecology)   Outpatient Medications Prior to Visit  Medication Sig   fluticasone  (FLONASE ) 50 MCG/ACT nasal spray Place 2 sprays into both nostrils daily.   norethindrone-ethinyl estradiol (OVCON-35) 0.4-35 MG-MCG tablet Ovcon-35 (28)   [DISCONTINUED] mupirocin  ointment (BACTROBAN ) 2 % Apply 1 Application topically 2 (two) times daily.  For 7 days   [DISCONTINUED] clobetasol ointment (TEMOVATE) 0.05 % APPLY A THIN LAYER TO THE AFFECTED AREA(S) BY TOPICAL ROUTE 2 TIMES PER DAY   [DISCONTINUED] nystatin  (MYCOSTATIN /NYSTOP ) powder Apply 1 Application topically 3 (three) times daily. (Patient not taking: Reported on 09/27/2023)   [DISCONTINUED] nystatin  cream (MYCOSTATIN ) Apply 1 Application topically 2 (two) times daily. (Patient not taking: Reported on 09/27/2023)   No facility-administered medications prior to visit.    ROS   Per HPI     Objective:     BP 107/72   Pulse 77   Temp 97.7 F (36.5 C) (Oral)   Ht 5' 6 (1.676 m)   Wt 160 lb 0.6 oz (72.6 kg)   LMP 09/26/2023   SpO2 98%   BMI 25.83 kg/m    Physical Exam Constitutional:      General: She is not in acute distress.    Appearance: Normal appearance.  HENT:     Right Ear: Tympanic membrane normal.     Left Ear: Tympanic membrane normal.     Mouth/Throat:     Mouth: Mucous membranes are moist.     Pharynx: Oropharynx is clear.  Eyes:     Pupils: Pupils are equal, round, and reactive to light.  Cardiovascular:     Rate and Rhythm: Normal rate and regular rhythm.     Heart sounds: Normal heart sounds. No murmur  heard.    No friction rub. No gallop.  Pulmonary:     Effort: Pulmonary effort is normal. No respiratory distress.     Breath sounds: Normal breath sounds.  Abdominal:     General: Abdomen is flat. Bowel sounds are normal.     Palpations: Abdomen is soft.  Musculoskeletal:        General: No swelling.  Skin:    General: Skin is warm and dry.  Neurological:     General: No focal deficit present.     Mental Status: She is alert.  Psychiatric:        Mood and Affect: Mood normal.        Behavior: Behavior normal.        Thought Content: Thought content normal.      No results found for any visits on 09/27/23.     Assessment & Plan:    Routine Health Maintenance and Physical Exam  Immunization History  Administered Date(s)  Administered   Influenza-Unspecified 12/15/2015, 12/23/2015, 01/13/2017, 12/14/2017, 12/29/2018    Health Maintenance  Topic Date Due   DTaP/Tdap/Td (1 - Tdap) Never done   Pneumococcal Vaccine 28-30 Years old (1 of 2 - PCV) Never done   Hepatitis B Vaccines (1 of 3 - 19+ 3-dose series) Never done   Fecal DNA (Cologuard)  Never done   COVID-19 Vaccine (1 - 2024-25 season) Never done   INFLUENZA VACCINE  10/15/2023   Cervical Cancer Screening (HPV/Pap Cotest)  04/02/2024   Hepatitis C Screening  Completed   HIV Screening  Completed   HPV VACCINES  Aged Out   Meningococcal B Vaccine  Aged Out    Discussed health benefits of physical activity, and encouraged her to engage in regular exercise appropriate for her age and condition.  Problem List Items Addressed This Visit       Musculoskeletal and Integument   Folliculitis (Chronic)   Resolved and stable. Will reoccur at times when she has a lot of hot flashes. Requesting refill on her mupirocin  ointment to have on hand as she used a lot of it to cover the surface area of her armpits when we were treating the initial infection.        Other   Healthcare maintenance   Patient will make follow up appointment for fasting labs. Will follow up w/ patient regarding results. Encouraged her to continue exercising and eating a well-balanced diet to support healthy lifestyle. Cologuard order placed today. Mammogram and Pap UTD through [facility] (will make an attempt to request records). Counseled on smoking cessation. Vaccines declined today. Will plan for next CPE in 1 year, sooner PRN. Patient verbalized understanding and was in agreement with the plan.       Encounter for tobacco use cessation counseling   Patient has reduced her use from 6-7 cigarettes daily to 4-5 cigarettes daily. Counseled her on the importance of complete cessation but also congratulated her on her progress. Declined nicotine replacement or cessation  medications at this time. Encouraged her to continue working on total cessation.      Other Visit Diagnoses       Screen for colon cancer    -  Primary   Relevant Orders   Cologuard     Encounter for smoking cessation counseling          Return in about 1 year (around 09/26/2024) for Physical.     Saddie JULIANNA Sacks, PA-C

## 2023-09-27 NOTE — Assessment & Plan Note (Signed)
 Patient has reduced her use from 6-7 cigarettes daily to 4-5 cigarettes daily. Counseled her on the importance of complete cessation but also congratulated her on her progress. Declined nicotine replacement or cessation medications at this time. Encouraged her to continue working on total cessation.

## 2023-10-19 ENCOUNTER — Ambulatory Visit

## 2023-10-20 ENCOUNTER — Telehealth: Admitting: Family Medicine

## 2023-10-20 DIAGNOSIS — J069 Acute upper respiratory infection, unspecified: Secondary | ICD-10-CM | POA: Diagnosis not present

## 2023-10-20 MED ORDER — PSEUDOEPH-BROMPHEN-DM 30-2-10 MG/5ML PO SYRP: 5.0000 mL | ORAL_SOLUTION | Freq: Four times a day (QID) | ORAL | 0 refills | Status: AC | PRN

## 2023-10-20 MED ORDER — BENZONATATE 100 MG PO CAPS: 100.0000 mg | ORAL_CAPSULE | Freq: Three times a day (TID) | ORAL | 0 refills | Status: AC | PRN

## 2023-10-20 NOTE — Progress Notes (Signed)

## 2023-11-05 ENCOUNTER — Other Ambulatory Visit

## 2023-11-05 DIAGNOSIS — Z130 Encounter for screening for diseases of the blood and blood-forming organs and certain disorders involving the immune mechanism: Secondary | ICD-10-CM | POA: Diagnosis not present

## 2023-11-05 DIAGNOSIS — Z1321 Encounter for screening for nutritional disorder: Secondary | ICD-10-CM | POA: Diagnosis not present

## 2023-11-05 DIAGNOSIS — Z13228 Encounter for screening for other metabolic disorders: Secondary | ICD-10-CM | POA: Diagnosis not present

## 2023-11-05 DIAGNOSIS — Z1329 Encounter for screening for other suspected endocrine disorder: Secondary | ICD-10-CM | POA: Diagnosis not present

## 2023-11-05 DIAGNOSIS — E875 Hyperkalemia: Secondary | ICD-10-CM

## 2023-11-05 DIAGNOSIS — Z0000 Encounter for general adult medical examination without abnormal findings: Secondary | ICD-10-CM

## 2023-11-06 LAB — CBC WITH DIFFERENTIAL/PLATELET
Basophils Absolute: 0.1 x10E3/uL (ref 0.0–0.2)
Basos: 1 %
EOS (ABSOLUTE): 0.4 x10E3/uL (ref 0.0–0.4)
Eos: 4 %
Hematocrit: 44.4 % (ref 34.0–46.6)
Hemoglobin: 13.8 g/dL (ref 11.1–15.9)
Immature Grans (Abs): 0 x10E3/uL (ref 0.0–0.1)
Immature Granulocytes: 0 %
Lymphocytes Absolute: 2.5 x10E3/uL (ref 0.7–3.1)
Lymphs: 27 %
MCH: 29.6 pg (ref 26.6–33.0)
MCHC: 31.1 g/dL — ABNORMAL LOW (ref 31.5–35.7)
MCV: 95 fL (ref 79–97)
Monocytes Absolute: 0.7 x10E3/uL (ref 0.1–0.9)
Monocytes: 7 %
Neutrophils Absolute: 5.8 x10E3/uL (ref 1.4–7.0)
Neutrophils: 61 %
Platelets: 401 x10E3/uL (ref 150–450)
RBC: 4.67 x10E6/uL (ref 3.77–5.28)
RDW: 12.1 % (ref 11.7–15.4)
WBC: 9.5 x10E3/uL (ref 3.4–10.8)

## 2023-11-06 LAB — LIPID PANEL
Chol/HDL Ratio: 3.5 ratio (ref 0.0–4.4)
Cholesterol, Total: 187 mg/dL (ref 100–199)
HDL: 53 mg/dL (ref >39)
LDL Chol Calc (NIH): 120 mg/dL — ABNORMAL HIGH (ref 0–99)
Triglycerides: 77 mg/dL (ref 0–149)
VLDL Cholesterol Cal: 14 mg/dL (ref 5–40)

## 2023-11-06 LAB — COMPREHENSIVE METABOLIC PANEL WITH GFR
ALT: 14 IU/L (ref 0–32)
AST: 20 IU/L (ref 0–40)
Albumin: 4.3 g/dL (ref 3.9–4.9)
Alkaline Phosphatase: 75 IU/L (ref 44–121)
BUN/Creatinine Ratio: 14 (ref 9–23)
BUN: 12 mg/dL (ref 6–24)
Bilirubin Total: 0.5 mg/dL (ref 0.0–1.2)
CO2: 21 mmol/L (ref 20–29)
Calcium: 9.5 mg/dL (ref 8.7–10.2)
Chloride: 99 mmol/L (ref 96–106)
Creatinine, Ser: 0.87 mg/dL (ref 0.57–1.00)
Globulin, Total: 2.9 g/dL (ref 1.5–4.5)
Glucose: 86 mg/dL (ref 70–99)
Potassium: 4.6 mmol/L (ref 3.5–5.2)
Sodium: 134 mmol/L (ref 134–144)
Total Protein: 7.2 g/dL (ref 6.0–8.5)
eGFR: 83 mL/min/1.73 (ref >59)

## 2023-11-06 LAB — HEMOGLOBIN A1C
Est. average glucose Bld gHb Est-mCnc: 103 mg/dL
Hgb A1c MFr Bld: 5.2 % (ref 4.8–5.6)

## 2023-11-06 LAB — TSH: TSH: 1.06 u[IU]/mL (ref 0.450–4.500)

## 2023-11-09 ENCOUNTER — Ambulatory Visit: Payer: Self-pay

## 2023-11-09 ENCOUNTER — Other Ambulatory Visit: Payer: Self-pay

## 2023-11-09 DIAGNOSIS — L739 Follicular disorder, unspecified: Secondary | ICD-10-CM

## 2023-11-09 MED ORDER — MUPIROCIN 2 % EX OINT: 1.0000 | TOPICAL_OINTMENT | Freq: Two times a day (BID) | CUTANEOUS | 0 refills | Status: AC

## 2024-06-14 ENCOUNTER — Ambulatory Visit: Admitting: Physician Assistant

## 2024-09-28 ENCOUNTER — Encounter
